# Patient Record
Sex: Male | Born: 1955 | Race: White | Hispanic: No | Smoking: Current every day smoker
Health system: Southern US, Community
[De-identification: ages and names within clinical notes are randomized; demographics above are authoritative.]

## PROBLEM LIST (undated history)

## (undated) DIAGNOSIS — I10 Essential (primary) hypertension: Secondary | ICD-10-CM

## (undated) DIAGNOSIS — C801 Malignant (primary) neoplasm, unspecified: Secondary | ICD-10-CM

## (undated) HISTORY — PX: LUNG CANCER SURGERY: SHX702

---

## 2002-06-12 ENCOUNTER — Encounter: Payer: Self-pay | Admitting: Cardiology

## 2002-06-12 ENCOUNTER — Observation Stay (HOSPITAL_COMMUNITY): Admission: EM | Admit: 2002-06-12 | Discharge: 2002-06-13 | Payer: Self-pay | Admitting: Emergency Medicine

## 2005-06-12 ENCOUNTER — Encounter: Admission: RE | Admit: 2005-06-12 | Discharge: 2005-06-12 | Payer: Self-pay | Admitting: Family Medicine

## 2016-05-16 DIAGNOSIS — E782 Mixed hyperlipidemia: Secondary | ICD-10-CM | POA: Insufficient documentation

## 2016-11-09 DIAGNOSIS — R7303 Prediabetes: Secondary | ICD-10-CM | POA: Diagnosis present

## 2018-01-08 ENCOUNTER — Telehealth: Payer: Self-pay | Admitting: Hematology

## 2018-01-08 NOTE — Telephone Encounter (Signed)
Spoke with patient regarding appointment D/T/Loc/Ph# °

## 2018-01-24 ENCOUNTER — Telehealth: Payer: Self-pay | Admitting: Hematology

## 2018-01-24 NOTE — Telephone Encounter (Signed)
PAL - moved new patient appointment from 1/28 to 2/7. Spoke with patient he is aware.

## 2018-01-27 ENCOUNTER — Encounter: Payer: Self-pay | Admitting: Hematology

## 2018-01-30 ENCOUNTER — Telehealth: Payer: Self-pay | Admitting: Hematology

## 2018-01-30 NOTE — Telephone Encounter (Signed)
Patient left message wanting to cancel 2/7 appointment with Dr. Candise CheKale and reschedule. Returned call and was not able to reach patient. Left message for patient confirming I received his message and cancelled 2/7 appointment. Patient asked to call me back directly to regarding when he can come in for appointment.

## 2018-01-30 NOTE — Telephone Encounter (Signed)
Returned call to patient re rescheduling new patient appointment. Spoke with patient re new appointment for 2/20.

## 2018-02-06 ENCOUNTER — Encounter: Payer: Self-pay | Admitting: Hematology

## 2018-02-17 NOTE — Progress Notes (Signed)
HEMATOLOGY/ONCOLOGY CONSULTATION NOTE  Date of Service: 02/19/2018  Patient Care Team: Patient, No Pcp Per as PCP - General (General Practice)  CHIEF COMPLAINTS/PURPOSE OF CONSULTATION:  Leukocytosis  HISTORY OF PRESENTING ILLNESS:   Jerry Wall is a wonderful 63 y.o. male who has been referred to [place] by Charlies Silvers P.A. for evaluation and management of leukocytosis.  The pt reports that he is doing well overall. The pt reports that he recalls being first aware of his high WBCs a few years ago, and that initially it was picked up through a routine visit. He denies any recent infections, nor taking steroids recently. He notes beginning cholesterol medication at the first of the year. He denies using any inhalers or having difficulty breathing or asthma-related symptoms.   He reports smoking a pack and a half of cigarettes each day. He reports that up until 10 years ago he had problems with excessive drinking, but has significantly decreased his ETOH intake since then. He reports having no desire to quit smoking or drinking because he enjoys them too much. He denies having had liver problems in the past.   Most recent lab results (01/01/18) of CBC is as follows: all values are WNL except for WBC at 12.1k, RBC at 4.56, MCV at 102.4, MCH at 34.1, Platelets at 370k.  On 12/27/16 WBC were at 13.4k, and on 12/21/16 his WBC were at 11.7k. On 06/18/16 his WBC were at 13.0k, and on 06/09/14 his WBC were at 13.7k.   On review of systems, pt denies fevers, chills, night sweats, breathing problems, abdominal pains, signs of infections, nausea, mouth sores, ulcers, back pains, and any other symptoms.   On PMHx the pt reports no significant history. On Social Hx the pt reports smoking a pack and a half of cigarettes each day. 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 He reports drinking about 5 drinks of ETOH each day.  On Family Hx the pt  reports liver cancer, but denies any other blood or cancer problems.   MEDICAL HISTORY:   1) Impaired fasting glucose 2) Cigarette nicotine dependence 3) Heravy ETOH use 4) TMJ Syndrome 5) Dyslipidemia   SURGICAL HISTORY:  Colonoscopy Dr [address] 08/10/2013  SOCIAL HISTORY: Social History   Socioeconomic History  . Marital status: Divorced    Spouse name: Not on file  . Number of children: Not on file  . Years of education: Not on file  . Highest education level: Not on file  Social Needs  . Financial resource strain: Not on file  . Food insecurity - worry: Not on file  . Food insecurity - inability: Not on file  . Transportation needs - medical: Not on file  . Transportation needs - non-medical: Not on file  Occupational History  . Not on file  Tobacco Use  . Smoking status: Current Every Day Smoker    Packs/day: 1.50    Years: 45.00    Pack years: 67.50    Types: Cigarettes  . Smokeless tobacco: Never Used  Substance and Sexual Activity  . Alcohol use: Yes    Comment: 4-5 beers daily  . Drug use: No  . Sexual activity: Not on file  Other Topics Concern  . Not on file  Social History Narrative  . Not on file    FAMILY HISTORY: No family history on file.  ALLERGIES:  has No Known Allergies.  MEDICATIONS:  Current Outpatient Medications  Medication  Sig Dispense Refill  . meloxicam (MOBIC) 15 MG tablet Take 15 mg by mouth daily.    . rosuvastatin (CRESTOR) 5 MG tablet Take 5 mg by mouth daily.    . sildenafil (VIAGRA) 100 MG tablet Take 100 mg by mouth as needed.     No current facility-administered medications for this visit.     REVIEW OF SYSTEMS:   .10 Point review of Systems was done is negative except as noted above.   PHYSICAL EXAMINATION: ECOG PERFORMANCE STATUS: 0 - Asymptomatic  VS reviewed in Epic - . GENERAL:alert, in no acute distress and comfortable SKIN: no acute rashes, no significant lesions EYES: conjunctiva are pink and  non-injected, sclera anicteric OROPHARYNX: MMM, no exudates, no oropharyngeal erythema or ulceration NECK: supple, no JVD LYMPH:  no palpable lymphadenopathy in the cervical, axillary or inguinal regions LUNGS: clear to auscultation b/l with normal respiratory effort HEART: regular rate & rhythm ABDOMEN:  normoactive bowel sounds , non tender, not distended. No palpable hepato-splenomegaly  Extremity: no pedal edema PSYCH: alert & oriented x 3 with fluent speech NEURO: no focal motor/sensory deficits    LABORATORY DATA:  I have reviewed the data as listed  . CBC Latest Ref Rng & Units 02/19/2018 02/19/2018  WBC 4.0 - 10.3 K/uL 13.6(H) -  Hematocrit 38.4 - 49.9 % 48.5 47.7  Platelets 140 - 400 K/uL 398 -   . CBC    Component Value Date/Time   WBC 13.6 (H) 02/19/2018 1155   RBC 4.81 02/19/2018 1155   RBC 4.81 02/19/2018 1155   HCT 48.5 02/19/2018 1155   HCT 47.7 02/19/2018 1154   PLT 398 02/19/2018 1155   MCV 100.8 (H) 02/19/2018 1155   MCH 34.1 (H) 02/19/2018 1155   MCHC 33.8 02/19/2018 1155   RDW 13.6 02/19/2018 1155   LYMPHSABS 4.2 (H) 02/19/2018 1155   MONOABS 1.3 (H) 02/19/2018 1155   EOSABS 0.1 02/19/2018 1155   BASOSABS 0.1 02/19/2018 1155    . CMP Latest Ref Rng & Units 02/19/2018  Glucose 70 - 140 mg/dL 85  BUN 7 - 26 mg/dL 14  Creatinine 2.130.70 - 0.861.30 mg/dL 5.781.19  Sodium 469136 - 629145 mmol/L 139  Potassium 3.5 - 5.1 mmol/L 4.5  Chloride 98 - 109 mmol/L 102  CO2 22 - 29 mmol/L 24  Calcium 8.4 - 10.4 mg/dL 52.810.1  Total Protein 6.4 - 8.3 g/dL 8.3  Total Bilirubin 0.2 - 1.2 mg/dL 0.4  Alkaline Phos 40 - 150 U/L 67  AST 5 - 34 U/L 20  ALT 0 - 55 U/L 43   Component     Latest Ref Rng & Units 02/19/2018  Folate, Hemolysate     Not Estab. ng/mL 395.0  HCT     37.5 - 51.0 % 47.7  Folate, RBC     >498 ng/mL 828  Retic Ct Pct     0.8 - 1.8 % 1.7  RBC.     4.20 - 5.82 MIL/uL 4.81  Retic Count, Absolute     34.8 - 93.9 K/uL 81.8  LDH     125 - 245 U/L 197    Vitamin B12     180 - 914 pg/mL 255    RADIOGRAPHIC STUDIES: I have personally reviewed the radiological images as listed and agreed with the findings in the report. No results found.  ASSESSMENT & PLAN:  63 y.o. male with  1. Leukocytosis/Neutrophilia Likely reactive and related to patients heavy smoking. Non progressive leucocytosis for atleast a  few years , no LNaednopathy or splenomegaly.  Smear - no increased blasts or left shift.  Overall presentation unlikely to represent a Myeloproliferative neoplasm Plan: -Discussed patient's most recent labs; leukocytosis since 2015, which has not progressed, and has fluctuated some, most likely indicating a reactive process to the patient's history of smoking.  -labs today show stable mild borderline neutrophilia, mild chronic lymphocytosis and borderline monocytosis - consistent with a reactive process. -would recommend f/u with PCP with CBC q6-12 months and re-consult [place] if WBC show progressive increase to >20k or if any new CBC abnormality arise -counseled on smoking cessation.  2) RBC Macrocytosis -likely from excessive ETOH use -Low B12 levels Plan -counseled on need to reduce ETOH use -B12 po daily recommended. -alsoRecommend taking a daily B complex. -f/u on rpt B12 levels with PCP In 6 months   Labs today RTC with Dr Candise Che as needed based on labs  All of the patients questions were answered with apparent satisfaction. The patient knows to call the clinic with any problems, questions or concerns.  . The total time spent in the appointment was 45 minutes and more than 50% was on counseling and direct patient cares.       Wyvonnia Lora MD MS AAHIVMS [facility] Hematology/Oncology Physician [facility]  (Office):       [phone] (Work cell):  [phone] (Fax):           [phone]  02/19/2018 10:40 AM   This document serves as a record of services personally performed by Wyvonnia Lora, MD. It  was created on his behalf by Marcelline Mates, a trained medical scribe. The creation of this record is based on the scribe's personal observations and the provider's statements to them.   .I have reviewed the above documentation for accuracy and completeness, and I agree with the above. Johney Maine MD MS

## 2018-02-19 ENCOUNTER — Inpatient Hospital Stay: Payer: 59 | Attending: Hematology | Admitting: Hematology

## 2018-02-19 ENCOUNTER — Encounter: Payer: Self-pay | Admitting: Hematology

## 2018-02-19 ENCOUNTER — Telehealth: Payer: Self-pay

## 2018-02-19 ENCOUNTER — Inpatient Hospital Stay: Payer: 59

## 2018-02-19 DIAGNOSIS — Z7289 Other problems related to lifestyle: Secondary | ICD-10-CM | POA: Diagnosis not present

## 2018-02-19 DIAGNOSIS — D7589 Other specified diseases of blood and blood-forming organs: Secondary | ICD-10-CM

## 2018-02-19 DIAGNOSIS — Z808 Family history of malignant neoplasm of other organs or systems: Secondary | ICD-10-CM | POA: Insufficient documentation

## 2018-02-19 DIAGNOSIS — Z720 Tobacco use: Secondary | ICD-10-CM | POA: Insufficient documentation

## 2018-02-19 DIAGNOSIS — D72829 Elevated white blood cell count, unspecified: Secondary | ICD-10-CM | POA: Diagnosis not present

## 2018-02-19 DIAGNOSIS — R718 Other abnormality of red blood cells: Secondary | ICD-10-CM | POA: Insufficient documentation

## 2018-02-19 LAB — CMP (CANCER CENTER ONLY)
ALBUMIN: 4.3 g/dL (ref 3.5–5.0)
ALT: 43 U/L (ref 0–55)
AST: 20 U/L (ref 5–34)
Alkaline Phosphatase: 67 U/L (ref 40–150)
Anion gap: 13 — ABNORMAL HIGH (ref 3–11)
BILIRUBIN TOTAL: 0.4 mg/dL (ref 0.2–1.2)
BUN: 14 mg/dL (ref 7–26)
CHLORIDE: 102 mmol/L (ref 98–109)
CO2: 24 mmol/L (ref 22–29)
CREATININE: 1.19 mg/dL (ref 0.70–1.30)
Calcium: 10.1 mg/dL (ref 8.4–10.4)
GFR, Est AFR Am: >60 mL/min (ref >60)
GLUCOSE: 85 mg/dL (ref 70–140)
POTASSIUM: 4.5 mmol/L (ref 3.5–5.1)
Sodium: 139 mmol/L (ref 136–145)
Total Protein: 8.3 g/dL (ref 6.4–8.3)

## 2018-02-19 LAB — CBC WITH DIFFERENTIAL (CANCER CENTER ONLY)
Basophils Absolute: 0.1 10*3/uL (ref 0.0–0.1)
Basophils Relative: 1 %
Eosinophils Absolute: 0.1 10*3/uL (ref 0.0–0.5)
Eosinophils Relative: 1 %
HEMATOCRIT: 48.5 % (ref 38.4–49.9)
HEMOGLOBIN: 16.4 g/dL (ref 13.0–17.1)
LYMPHS ABS: 4.2 10*3/uL — AB (ref 0.9–3.3)
LYMPHS PCT: 31 %
MCH: 34.1 pg — AB (ref 27.2–33.4)
MCHC: 33.8 g/dL (ref 32.0–36.0)
MCV: 100.8 fL — AB (ref 79.3–98.0)
MONO ABS: 1.3 10*3/uL — AB (ref 0.1–0.9)
MONOS PCT: 10 %
NEUTROS ABS: 7.9 10*3/uL — AB (ref 1.5–6.5)
NEUTROS PCT: 57 %
Platelet Count: 398 10*3/uL (ref 140–400)
RBC: 4.81 MIL/uL (ref 4.20–5.82)
RDW: 13.6 % (ref 11.0–14.6)
WBC Count: 13.6 10*3/uL — ABNORMAL HIGH (ref 4.0–10.3)

## 2018-02-19 LAB — RETICULOCYTES
RBC.: 4.81 MIL/uL (ref 4.20–5.82)
Retic Count, Absolute: 81.8 10*3/uL (ref 34.8–93.9)
Retic Ct Pct: 1.7 % (ref 0.8–1.8)

## 2018-02-19 LAB — VITAMIN B12: Vitamin B-12: 255 pg/mL (ref 180–914)

## 2018-02-19 LAB — LACTATE DEHYDROGENASE: LDH: 197 U/L (ref 125–245)

## 2018-02-19 LAB — SAVE SMEAR

## 2018-02-19 NOTE — Telephone Encounter (Signed)
Per 2/20 los. Added patient to lab for today will return depending on lab results.

## 2018-02-19 NOTE — Patient Instructions (Signed)
Thank you for choosing [facility] to provide your oncology and hematology care.  To afford each patient quality time with our providers, please arrive 30 minutes before your scheduled appointment time.  If you arrive late for your appointment, you may be asked to reschedule.  We strive to give you quality time with our providers, and arriving late affects you and other patients whose appointments are after yours.   If you are a no show for multiple scheduled visits, you may be dismissed from the clinic at the providers discretion.    Again, thank you for choosing [facility], our hope is that these requests will decrease the amount of time that you wait before being seen by our physicians.  ______________________________________________________________________  Should you have questions after your visit to the [facility], please contact our office at [phone] between the hours of 8:30 and 4:30 p.m.    Voicemails left after 4:30p.m will not be returned until the following business day.    For prescription refill requests, please have your pharmacy contact us directly.  Please also try to allow 48 hours for prescription requests.    Please contact the scheduling department for questions regarding scheduling.  For scheduling of procedures such as PET scans, CT scans, MRI, Ultrasound, etc please contact central scheduling at [phone].    Resources For Cancer Patients and Caregivers:   Oncolink.org:  A wonderful resource for patients and healthcare providers for information regarding your disease, ways to tract your treatment, what to expect, etc.     American Cancer Society:  800-227-2345  Can help patients locate various types of support and financial assistance  Cancer Care: 1-800-813-HOPE (4673) Provides financial assistance, online support groups, medication/co-pay assistance.    [place] DSS:  336-641-3447 Where to apply for food  stamps, Medicaid, and utility assistance  Medicare Rights Center: 800-333-4114 Helps people with Medicare understand their rights and benefits, navigate the Medicare system, and secure the quality healthcare they deserve  SCAT: 336-333-6589 [place] [employer]'s shared-ride transportation service for eligible riders who have a disability that prevents them from riding the fixed route bus.    For additional information on assistance programs please contact our social worker:   Grier Hock/Abigail Elmore:  [phone]            

## 2018-02-20 LAB — FOLATE RBC
Folate, Hemolysate: 395 ng/mL
Folate, RBC: 828 ng/mL (ref >498)
Hematocrit: 47.7 % (ref 37.5–51.0)

## 2018-02-26 ENCOUNTER — Telehealth: Payer: Self-pay

## 2018-02-26 NOTE — Telephone Encounter (Signed)
Called pt with update on lab work per Dr. Candise CheKale. WBC elevated. Likely d/t smoking. Pt B12 low and pt to start taking OTC B12 po 1000mcg daily. Pt to f/u with PCP in 6 months to repeat lab work. Pt verbalized understanding of changes.

## 2018-03-03 ENCOUNTER — Encounter: Payer: Self-pay | Admitting: Hematology and Oncology

## 2018-06-27 ENCOUNTER — Other Ambulatory Visit: Payer: Self-pay | Admitting: Physician Assistant

## 2018-06-27 DIAGNOSIS — R0989 Other specified symptoms and signs involving the circulatory and respiratory systems: Secondary | ICD-10-CM

## 2018-07-07 ENCOUNTER — Ambulatory Visit
Admission: RE | Admit: 2018-07-07 | Discharge: 2018-07-07 | Disposition: A | Discharge status: Home / Self Care | Payer: 59 | Source: Ambulatory Visit | Attending: Physician Assistant | Admitting: Physician Assistant

## 2018-07-07 DIAGNOSIS — R0989 Other specified symptoms and signs involving the circulatory and respiratory systems: Secondary | ICD-10-CM

## 2020-01-04 ENCOUNTER — Other Ambulatory Visit: Payer: Self-pay | Admitting: Physician Assistant

## 2020-01-04 DIAGNOSIS — Z122 Encounter for screening for malignant neoplasm of respiratory organs: Secondary | ICD-10-CM

## 2020-01-12 ENCOUNTER — Ambulatory Visit: Payer: 59

## 2020-01-12 ENCOUNTER — Ambulatory Visit
Admission: RE | Admit: 2020-01-12 | Discharge: 2020-01-12 | Disposition: A | Discharge status: Home / Self Care | Payer: Managed Care, Other (non HMO) | Source: Ambulatory Visit | Attending: Physician Assistant | Admitting: Physician Assistant

## 2020-01-12 DIAGNOSIS — Z122 Encounter for screening for malignant neoplasm of respiratory organs: Secondary | ICD-10-CM

## 2020-03-25 DIAGNOSIS — Z902 Acquired absence of lung [part of]: Secondary | ICD-10-CM

## 2020-04-12 DIAGNOSIS — C3411 Malignant neoplasm of upper lobe, right bronchus or lung: Secondary | ICD-10-CM | POA: Diagnosis present

## 2020-08-10 DIAGNOSIS — C3412 Malignant neoplasm of upper lobe, left bronchus or lung: Secondary | ICD-10-CM | POA: Diagnosis present

## 2021-02-08 IMAGING — CT CT CHEST LUNG CANCER SCREENING LOW DOSE W/O CM
2 series · 13 of 35 positions shown, 16 images · non-contrast
Comparison: None.

CLINICAL DATA: Lung cancer screening. Current asymptomatic smoker
with 80 pack-year history.

EXAM:
CT CHEST WITHOUT CONTRAST LOW-DOSE FOR LUNG CANCER SCREENING
TECHNIQUE: Multidetector CT imaging of the chest was performed following the
standard protocol without IV contrast.

[Series 2: lung 5.00 br40 axial · axial · 0.59mm/px · z∈[-1073,-788]mm · 10 of 67 slices shown, 13 images]
[im 5/67  mediastinal]
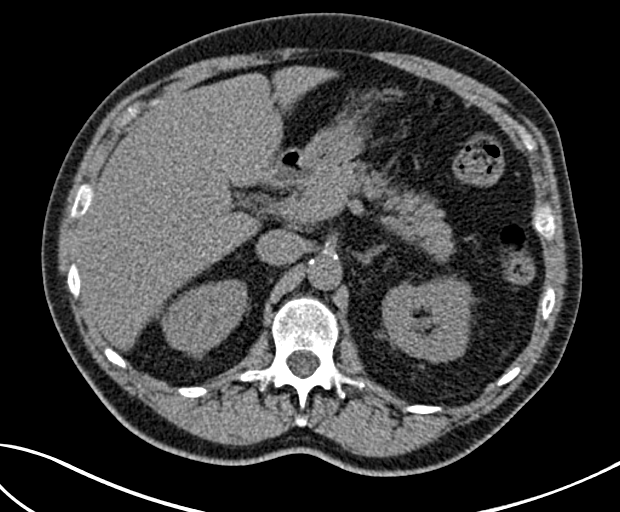
[im 5/67  lung]
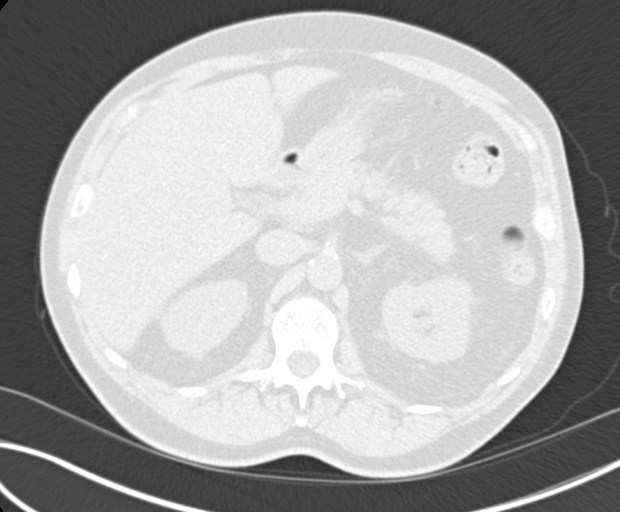
[im 13/67  lung]
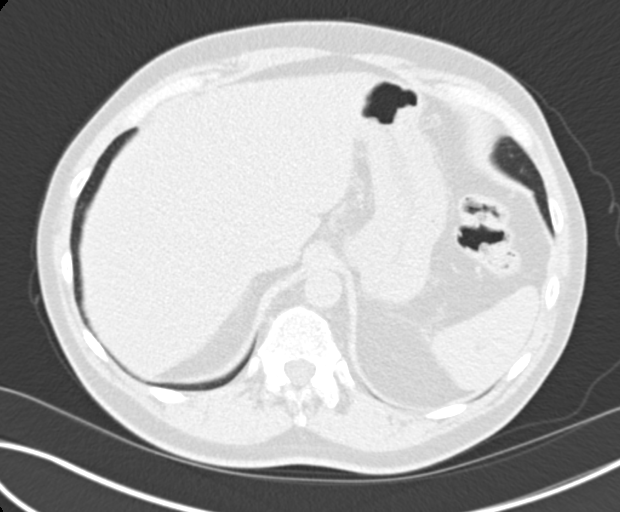
[im 18/67  lung]
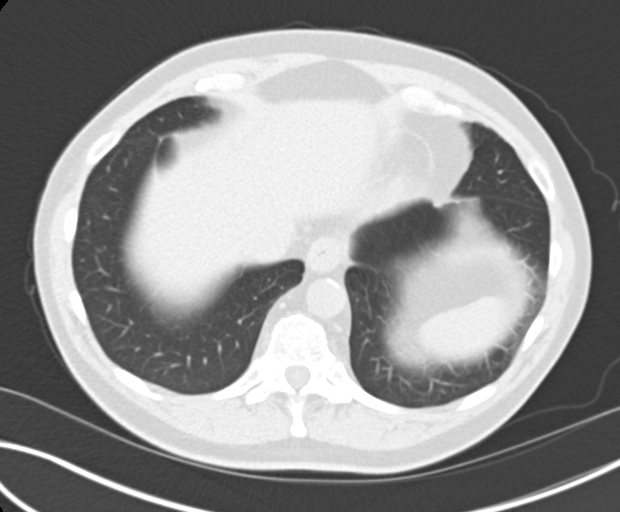
[im 25/67  lung]
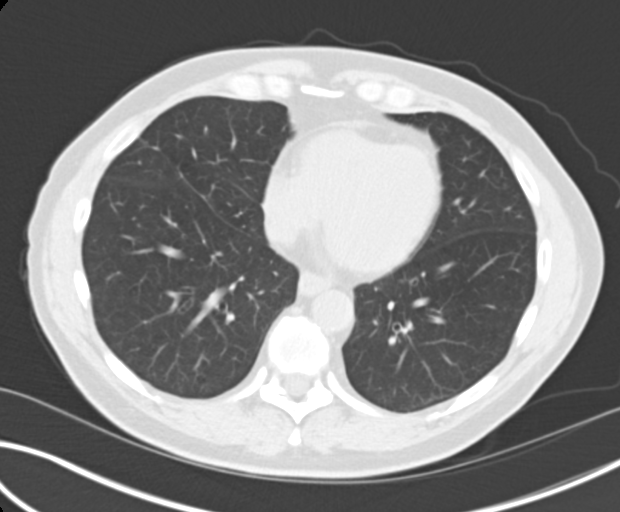
[im 32/67  mediastinal]
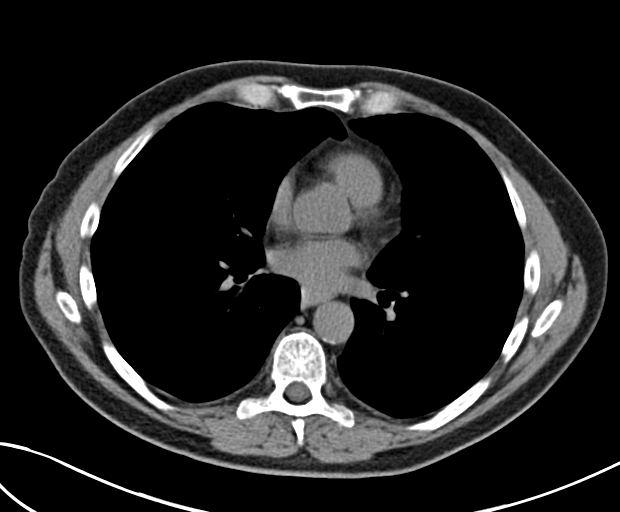
[im 32/67  lung]
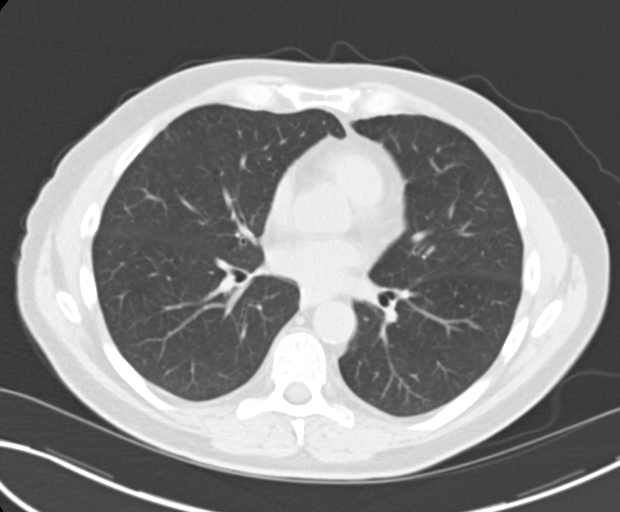
[im 35/67  lung]
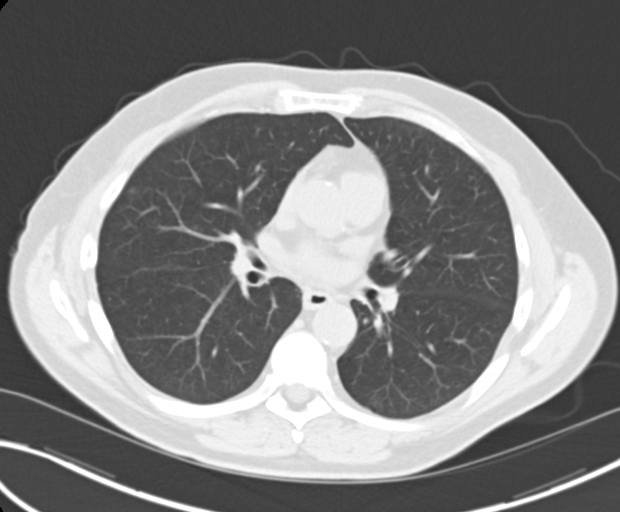
[im 42/67  lung]
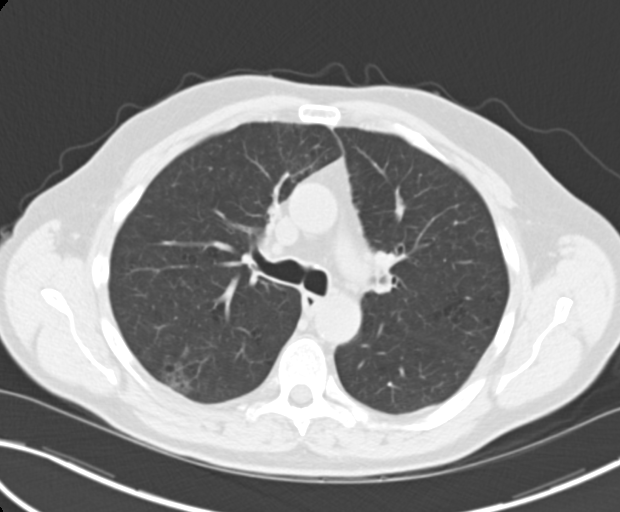
[im 49/67  lung]
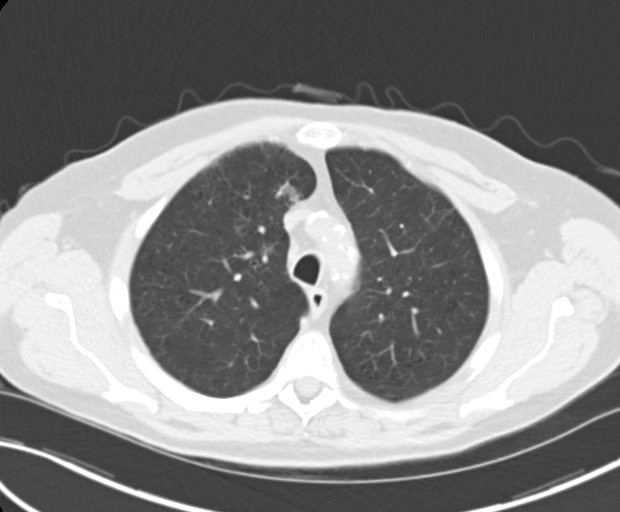
[im 54/67  mediastinal]
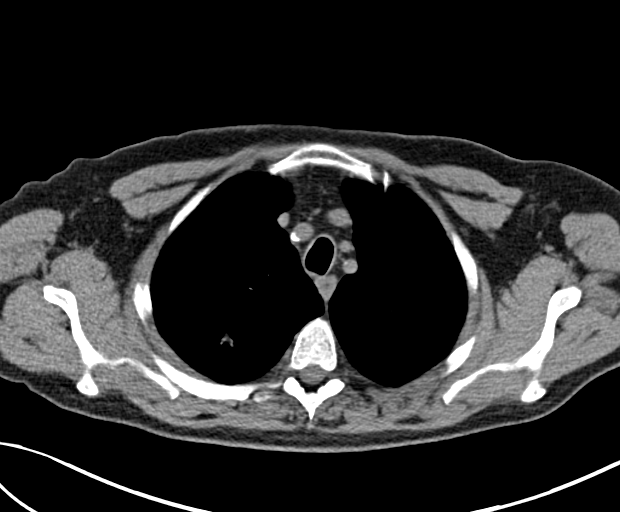
[im 54/67  lung]
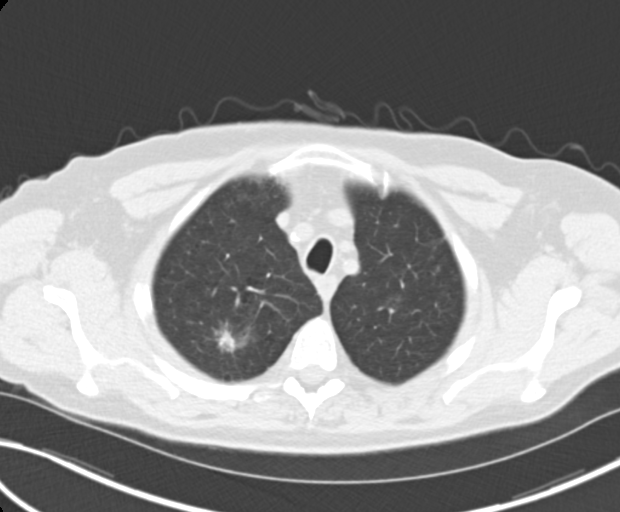
[im 62/67  lung]
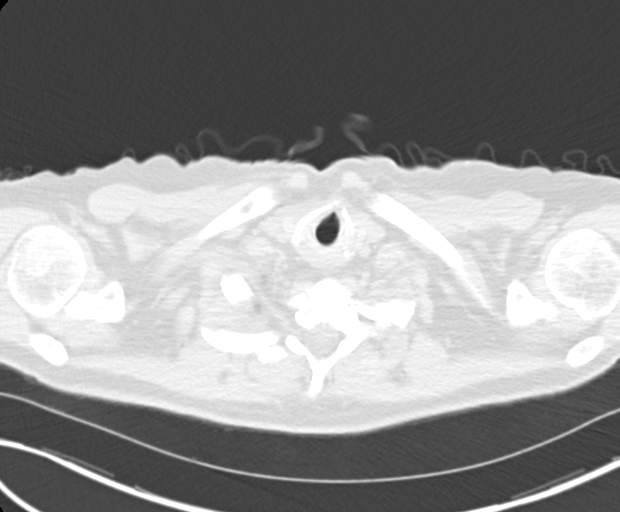

[Series 4: lung 1.00 br44 cor · coronal · 0.66mm/px · 3 of 297 slices shown]
[im 60/297  lung]
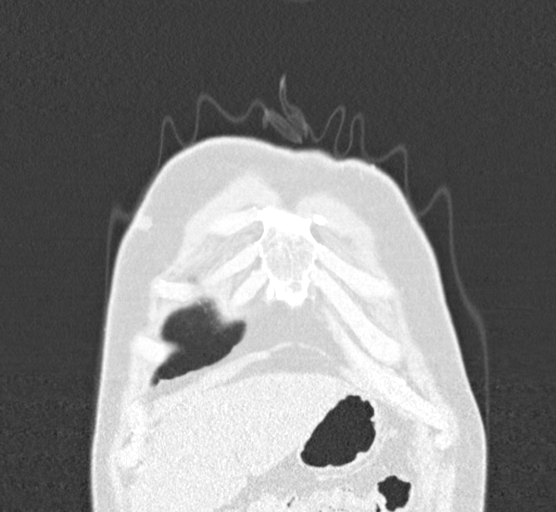
[im 119/297  lung]
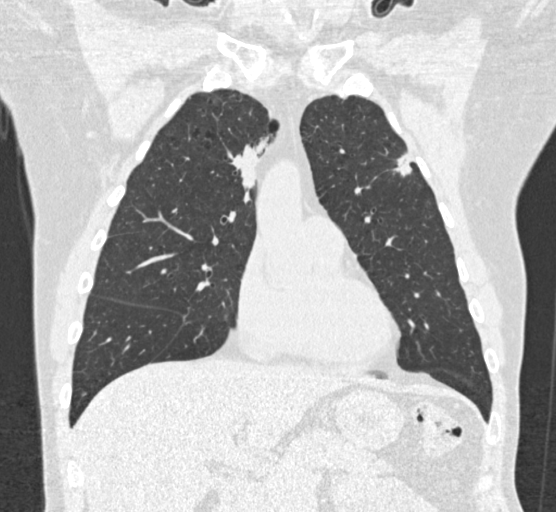
[im 178/297  lung]
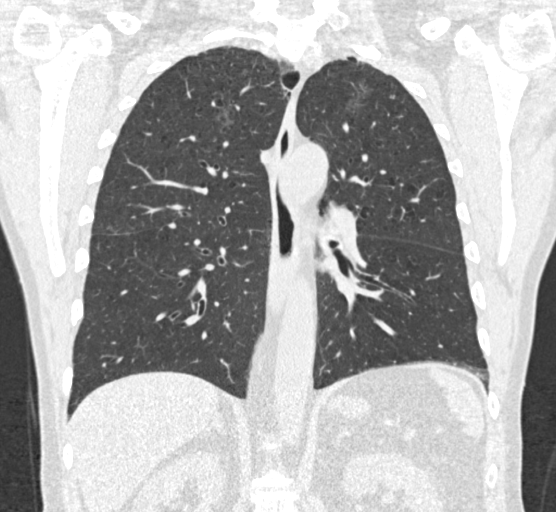

[13 of 35 positions shown; findings below may reference images not displayed]

FINDINGS: Cardiovascular: The heart size appears within normal limits. No
pericardial effusion. Aortic atherosclerosis.

Mediastinum/Nodes: No enlarged mediastinal, hilar, or axillary lymph
nodes. Thyroid gland, trachea, and esophagus demonstrate no
significant findings.

Lungs/Pleura: Advanced changes of centrilobular and paraseptal
emphysema. Diffuse bronchial wall thickening. No pleural effusion,
airspace consolidation or atelectasis. Multiple solid and non solid
lesions are identified in both lungs with an upper lung zone
predominance. The largest solid lesion is in the paramediastinal
right upper lobe with a mean derived diameter of 2.6 cm. In the left
upper lobe there is a solid spiculated nodule which has a mean
derived diameter of 1.6 cm. In the posterior right upper lobe there
is a spiculated lesion with a mean diameter of 11.6 cm.
Additionally, multifocal bilateral ground-glass attenuating nodules
are scattered throughout both lungs.

Upper Abdomen: No acute abnormality.

Musculoskeletal: No chest wall mass or suspicious bone lesions
identified.
IMPRESSION: 1. Lung-RADS 4Bs, suspicious. Additional imaging evaluation or
consultation with Pulmonology or Thoracic Surgery recommended.
2. The S modifier above refers to multifocal solid spiculated
lesions within both upper lobes concerning for malignancy. Further
investigation with PET-CT is recommended followed by tissue
sampling. Additionally, there are multiple non solid lesions within
upper lung zone predominance which may represent multifocal
pulmonary adenocarcinoma.
3. Aortic Atherosclerosis (ZUI45-MV6.6) and Emphysema (ZUI45-3DV.B).

## 2022-05-17 NOTE — Progress Notes (Signed)
 Subjective:    Patient ID: Jerry Wall. is a 67 y.o. male.  HPI  Jerry Wall. is a 67 y.o. 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 Blake of Rad/Onc.   Treatment: Multipleprimary lung cancers. In 2021, less than 1% of cells were positive for PD-L1. EGFR, ROS1, and ALK were negative.  1. S/P right upper lobectomy 03/23/2020 removing 2 right upper lobe primaries. This lefta left upper lobe 1.7 x 1.4 x 1.5. This is his new baseline.He did had adjuvant chemotherapy for the 4 cm tumor.  2. S/P4cycle Cisplatin/Alimta.05/02/2020 to7/05/2020 had significant hiccups With first dose (10 days up to 100 an hour).   Post adjuvant chemo:CT of the chest and the pelvis on 07/25/2020 showeddiminished consolidative changes resection margin in the right chest. Diminished left upper lobe pulmonary nodule. This suggests response to treatment. The areas of hyperdensity and hypervascularity along the left hemiliver. Septal groundglass and septal thickening with suggestion of mild bronchiectatic changes in the left upper lobe.The upper lobe nodule had shrunk from 1.7 x 1.4 to 1 x 0.7.  Today Jerry Wall reports he was having follow-up for his lung cancer when they noted progression and decided to take further imaging of his brain.  He states he has not noticed any new symptoms.  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  He also continues to smoke half pack of cigarettes per day.  He admits over the last few weeks his vision has become a little bit blurry however he attributes this to his contacts.  Denies any diplopia or peripheral deficits.  Denies any seizure activity.  He does admit to some lower extremity fatigue occasionally.  Denies headaches,  dizziness, nausea, vomiting, hearing loss, changes in balance or gait, weakness, numbness, tingling, changes in dexterity, foot drop, confusion or speech difficulties.  The following portions of the patient's history were reviewed and updated as appropriate: allergies, current medications, past family history, past medical history, past social history, past surgical history and problem list.  Review of Systems  Constitutional: Negative for activity change and unexpected weight change.  HENT: Negative for hearing loss, trouble swallowing and voice change.   Eyes: Negative for visual disturbance.  Gastrointestinal: Negative for nausea and vomiting.  Genitourinary: Negative for enuresis.  Musculoskeletal: Negative for back pain, gait problem, neck pain and neck stiffness.  Neurological: Negative for dizziness, seizures, syncope, facial asymmetry, speech difficulty, weakness, light-headedness, numbness and headaches.  Psychiatric/Behavioral: Negative for agitation, behavioral problems, confusion, decreased concentration and dysphoric mood.  All other systems reviewed and are negative.   Objective:  Neurologic Exam   Mental Status  Oriented to person, place, and time.  Attention: normal. Concentration: normal.  Speech: speech is normal  Level of consciousness: alert Knowledge: good.  Normal comprehension.   Cranial Nerves   CN II  Visual fields full to confrontation.  Visual acuity: normal with correction Right visual field deficit: none Left visual field deficit: none   CN III, IV, VI  Pupils are equal, round, and reactive to light. Extraocular motions are normal.   CN V  Facial sensation intact.   CN VII  Facial expression full, symmetric.   CN VIII  Hearing: intact  CN XI  CN XI normal.   CN XII  CN XII normal.   Motor Exam  Muscle bulk: normal Overall muscle tone: normal  Strength  Strength 5/5 throughout.   Sensory Exam  Light touch normal.   Gait,  Coordination, and Reflexes   Gait Gait: normal  Coordination  Romberg: negative Tandem walking coordination: normal   Physical Exam Vitals reviewed.  Constitutional:      Appearance: Normal appearance.  HENT:     Head: Normocephalic.  Eyes:     Extraocular Movements: Extraocular movements intact and EOM normal.     Pupils: Pupils are equal, round, and reactive to light.  Cardiovascular:     Rate and Rhythm: Normal rate.  Pulmonary:     Effort: Pulmonary effort is normal.  Musculoskeletal:        General: Normal range of motion.     Cervical back: Normal range of motion and neck supple.  Skin:    General: Skin is warm and dry.  Neurological:     General: No focal deficit present.     Mental Status: He is alert and oriented to person, place, and time.     Motor: Motor strength is normal.     Coordination: Romberg Test normal.     Gait: Gait is intact. Tandem walk normal.  Psychiatric:        Mood and Affect: Mood normal.        Speech: Speech normal.        Behavior: Behavior normal.        Thought Content: Thought content normal.        Judgment: Judgment normal.    I spent a total of 20 minutes in face-to-face and non-face-to-face activities for this patient on the day of the visit. Professional time spent includes the following activities: reviewing previous medical and surgical records, evaluating the patient's current symptoms/allergies/medications, completing the physical examination, ordering tests and medications and documenting in the electronic medical record. This was independent of the time spent by the physician.  This is a shared visit with Dr. Yancy Fare and Katelyn Welch, PA-C. Electronically signed by:  Katelyn Ann Welch, PA-C, 05/18/2022 10:21 AM  Assessment:  Assessment   This 67 year old man presents for evaluation and management of a newly identified 6 mm left parietal brain metastasis secondary to lung adenocarcinoma. This mass is currently  asymptomatic.    Plan:  Plan   I reviewed the imaging findings with Jerry Wall and discussed management options. I recommend Gamma Knife radiosurgery (GKRS) for him. Jerry Wall demonstrated good understanding and requests GKRS. Arrangements will be made for Jerry Wall to undergo GKRS on the next available treatment date.  I have personally spent 45 minutes involved in face-to-face and non-face-to-face activities for this patient on the day of the visit.  Professional time spent includes the following activities, in addition to those noted in the documentation: independent review of imaging, assessment, and management plan.  Electronically signed by: Yancy Ryan Fare, MD 05/18/2022 10:28 AM        Electronically signed by: Yancy Ryan Fare, MD 05/18/22 1034

## 2022-05-18 DIAGNOSIS — C7931 Secondary malignant neoplasm of brain: Secondary | ICD-10-CM | POA: Diagnosis present

## 2022-05-30 NOTE — Progress Notes (Signed)
 GKRS brain met of lung primary   Electronically signed by: Yancy Ryan Fare, MD 05/30/22 [phone]

## 2022-05-30 NOTE — Unmapped External Note (Signed)
 Procedure date: May 29, 2022  Pre-operative Diagnosis: Brain metastasis of lung carcinoma primary.  Post-operative Diagnosis:  Same.  Procedure:  Gamma Knife stereotactic radiosurgery. LESION TREATED:   1. Left parietal metastasis (20 Gy to the 50% isodose line)   COLLIMATOR SIZE:  4, 8 mm  TOTAL NUMBER OF ISOCENTERS:   4  Surgeon:  Yancy MICAEL Fare, MD, [facility], [facility]  Anesthesia:  Local.  Operative Indications: We discussed the risks and potential benefits of radiosurgery, fractionated radiation, systemic or local chemotherapy, experimental treatments, open surgery, combinations of these treatments, and observation. Possible late effects of radiation including radiation necrosis requiring steroids or surgery and leading to temporary or permanent loss of any or all neurologic functions were explained.  The potential disadvantages of not having a tissue diagnosis of each lesion were reviewed.  Risks of glucocorticoids including aseptic necrosis of the major joints, osteoporotic fractures, diabetes with all of its risks, axial weight gain, DVT/PE, and bowel perforation which could be fatal were covered. We discussed the roles of all of the members of the radiosurgical team and the potential benefits and risks of overlapping procedures; informed consent for overlapping procedures was obtained.  Mr. Meloche expresses good understanding of the relative risks and potential benefits of the available alternatives and requests radiosurgery with the [facility].  Description of the Procedure:  Mr. Cerrone was admitted to the [facility] Suite where a four-pin Le[place]ell stereotactic frame was placed using local anesthetic consisting of 2% lidocaine  mixed 1:1 with long-acting local anesthetic. Prophylactic antibiotics are not indicated for [facility] radiosurgery because of the absence of a surgical incision. DVT prophylaxis is not indicated during Gamma Knife radiosurgery for Mr. Creamer because no anesthesia is  employed and frequent extremity movement/ambulation occurs throughout the procedure. Magnetic resonance imaging was obtained including high-resolution, post intravenous gadolinium, volumetric images through the lesions.  The images were transferred to the [facility] workstation.  A plan was designed that nicely encompassed the metastasis. Dr. Christina K. Cramer then prescribed a dose of 20 Gy to the 50% isodose line which conformed to the outlined tumor-volume.  Mr. Utsey was admitted to the [facility] suite where pre-procedure verification (time out) was performed per institutional protocol. The radiosurgical dose was administered without incident.   At the conclusion of treatment the stereotactic frame was removed and the pin sites were treated with antibiotic ointment and dressed with Band-Aids. No incision was made nor were instruments or sponges inserted; thus all sponges and instruments were accounted for.  Mr. Burgard was then discharged with plans for follow up with a brain MRI scan in approximately twelve weeks. Signs of increased intracranial pressure were reviewed. Focal neurologic problems were also described. He understands that worsening symptoms may occur as the tumor dies and that if they do steroids (Decadron ) will be a primary treatment modality and that if the steroids are unsuccessful open surgery may be required. He knows to call or seek medical attention before then should any problems or questions arise that are potentially related to the tumor or to the [facility] treatment and agrees to do so.   My contact information is: Department of Neurosurgery [facility] of Medicine [facility] [place], [place] [address] Direct Phone: [phone].    Electronically signed by: Yancy Ryan Fare, MD 05/30/22 [phone]

## 2022-09-06 DIAGNOSIS — C781 Secondary malignant neoplasm of mediastinum: Secondary | ICD-10-CM | POA: Diagnosis present

## 2022-09-06 DIAGNOSIS — C3490 Malignant neoplasm of unspecified part of unspecified bronchus or lung: Secondary | ICD-10-CM | POA: Insufficient documentation

## 2023-07-13 ENCOUNTER — Emergency Department (HOSPITAL_BASED_OUTPATIENT_CLINIC_OR_DEPARTMENT_OTHER): Payer: Medicare Other | Admitting: Radiology

## 2023-07-13 ENCOUNTER — Other Ambulatory Visit: Payer: Self-pay

## 2023-07-13 ENCOUNTER — Emergency Department (HOSPITAL_BASED_OUTPATIENT_CLINIC_OR_DEPARTMENT_OTHER)
Admission: EM | Admit: 2023-07-13 | Discharge: 2023-07-13 | Disposition: A | Discharge status: Home / Self Care | Payer: Medicare Other | Attending: Emergency Medicine | Admitting: Emergency Medicine

## 2023-07-13 DIAGNOSIS — W108XXA Fall (on) (from) other stairs and steps, initial encounter: Secondary | ICD-10-CM | POA: Diagnosis not present

## 2023-07-13 DIAGNOSIS — R0781 Pleurodynia: Secondary | ICD-10-CM

## 2023-07-13 DIAGNOSIS — J189 Pneumonia, unspecified organism: Secondary | ICD-10-CM | POA: Diagnosis not present

## 2023-07-13 DIAGNOSIS — W19XXXA Unspecified fall, initial encounter: Secondary | ICD-10-CM

## 2023-07-13 MED ORDER — HYDROCODONE-ACETAMINOPHEN 5-325 MG PO TABS: 1.0000 | ORAL_TABLET | Freq: Four times a day (QID) | ORAL | 0 refills | Status: DC | PRN

## 2023-07-13 MED ORDER — LIDOCAINE 5 % EX PTCH: 1.0000 | MEDICATED_PATCH | CUTANEOUS | 0 refills | Status: DC

## 2023-07-13 MED ORDER — AZITHROMYCIN 250 MG PO TABS: 250.0000 mg | ORAL_TABLET | Freq: Every day | ORAL | 0 refills | Status: DC

## 2023-07-13 MED ORDER — LIDOCAINE 5 % EX PTCH
1.0000 | MEDICATED_PATCH | CUTANEOUS | Status: DC
  Administered 2023-07-13: 1 via TRANSDERMAL
  Filled 2023-07-13: qty 1

## 2023-07-13 MED ORDER — AMOXICILLIN-POT CLAVULANATE 875-125 MG PO TABS: 1.0000 | ORAL_TABLET | Freq: Two times a day (BID) | ORAL | 0 refills | Status: DC

## 2023-07-13 MED ORDER — HYDROCODONE-ACETAMINOPHEN 5-325 MG PO TABS
1.0000 | ORAL_TABLET | Freq: Once | ORAL | Status: AC
  Administered 2023-07-13: 1 via ORAL
  Filled 2023-07-13: qty 1

## 2023-07-13 NOTE — ED Provider Notes (Signed)
[facility]ERGENCY DEPARTMENT AT [facility] Provider Note   CSN: [id number] Arrival date & time: 07/13/23  1541     History  Chief Complaint  Patient presents with   Back Pain   Fall    Jerry Wall is a 68 y.o. male.  Patient with history of metastatic lung cancer currently on immunotherapy presents today with complaints of fall.  He states that same occurred a few days ago when he lost his balance and fell backwards on 1 step.  He states that he hit the middle of his back on the ground.  He did not hit his head or lose consciousness.  He is not anticoagulated.  He was able to get up off the ground and walk around without issue.  He walks with a cane which is his baseline.  He notes pain to his left upper back in his rib cage area.  States that it hurts to take a deep breath.  He tried to manage his pain with over-the-counter medication but has not been successful.  Denies any chest pain or shortness of breath.  Does state that he has had an increased cough and congestion over the last few days as well.  He denies any headache, neck pain, low back pain, sharp shooting pain in his extremities, numbness/tingling.  No loss of bowel or bladder function or saddle paresthesias.   Back Pain Fall       Home Medications Prior to Admission medications   Medication Sig Start Date End Date Taking? Authorizing Provider  meloxicam (MOBIC) 15 MG tablet Take 15 mg by mouth daily. 02/12/18   [provider]  rosuvastatin (CRESTOR) 5 MG tablet Take 5 mg by mouth daily. 01/02/18   [provider]  sildenafil (VIAGRA) 100 MG tablet Take 100 mg by mouth as needed. 01/02/18   [provider]      Allergies    Patient has no known allergies.    Review of Systems   Review of Systems  Musculoskeletal:  Positive for back pain.  All other systems reviewed and are negative.   Physical Exam Updated Vital Signs BP 113/77   Pulse 88   Temp 98.4 F (36.9 C) (Oral)    Resp 18   SpO2 95%  Physical Exam Vitals and nursing note reviewed.  Constitutional:      General: He is not in acute distress.    Appearance: Normal appearance. He is normal weight. He is not ill-appearing, toxic-appearing or diaphoretic.  HENT:     Head: Normocephalic and atraumatic.     Comments: No racoon eyes No battle sign Cardiovascular:     Rate and Rhythm: Normal rate and regular rhythm.     Heart sounds: Normal heart sounds.  Pulmonary:     Effort: Pulmonary effort is normal. No respiratory distress.     Breath sounds: Normal breath sounds.  Abdominal:     General: Abdomen is flat.     Palpations: Abdomen is soft.     Tenderness: There is no abdominal tenderness.  Musculoskeletal:        General: Normal range of motion.     Cervical back: Normal, normal range of motion and neck supple. No tenderness.     Thoracic back: Normal.     Lumbar back: Normal.       Back:     Comments: TTP over the left lateral rib cage per above images.  No bruising, deformity, crepitus, or overlying skin changes.  No  tenderness to palpation of cervical, thoracic, or lumbar spine.  Patient observed to be ambulatory with his cane which is his baseline.  No other areas of focal bony tenderness.  Skin:    General: Skin is warm and dry.  Neurological:     General: No focal deficit present.     Mental Status: He is alert and oriented to person, place, and time.  Psychiatric:        Mood and Affect: Mood normal.        Behavior: Behavior normal.     ED Results / Procedures / Treatments   Labs (all labs ordered are listed, but only abnormal results are displayed) Labs Reviewed - No data to display  EKG None  Radiology DG Ribs Unilateral W/Chest Left  Result Date: 07/13/2023 CLINICAL DATA:  Fall EXAM: LEFT RIBS AND CHEST - 3+ VIEW COMPARISON:  02/26/2022, PET CT 07/05/2023 FINDINGS: Stable pulmonary insufflation. Bilateral upper lobe parenchymal scarring again noted. Patchy opacity  within the left mid lung zone is new from prior examination as well as prior PET CT examination and may represent a developing infectious or inflammatory infiltrate. Cardiac size within normal limits. No pneumothorax or pleural effusion. Right internal jugular chest port tip seen at the superior cavoatrial junction. Pulmonary vascularity is normal. No acute bone abnormality. Specifically, no acute rib fracture identified. IMPRESSION: 1. No acute rib fracture identified. 2. Patchy opacity within the left mid lung zone, new from prior examination as well as prior PET CT examination and may represent a developing infectious or inflammatory infiltrate. Radiographic follow-up to resolution is recommended. Electronically Signed   By: Helyn Numbers M.D.   On: 07/13/2023 20:10    Procedures Procedures    Medications Ordered in ED Medications  lidocaine (LIDODERM) 5 % 1 patch (1 patch Transdermal Patch Applied 07/13/23 1923)  HYDROcodone-acetaminophen (NORCO/VICODIN) 5-325 MG per tablet 1 tablet (1 tablet Oral Given 07/13/23 1923)    ED Course/ Medical Decision Making/ A&P                             Medical Decision Making Amount and/or Complexity of Data Reviewed Radiology: ordered.  Risk Prescription drug management.   This patient is a 68 y.o. male who presents to the ED for concern of fall x 3 days ago with left ribcage pain.   Differential diagnoses prior to evaluation: Rib fracture, pulmonary contusion, trauma  Past Medical History / Social History / Additional history: Chart reviewed. Pertinent results include: History of metastatic lung cancer currently on immunotherapy  Physical Exam: Physical exam performed. The pertinent findings include: TTP left lateral rib cage per above  Imaging: Ordered left rib cage x-ray with chest which has resulted and reveals  1. No acute rib fracture identified. 2. Patchy opacity within the left mid lung zone, new from prior examination as well as  prior PET CT examination and may represent a developing infectious or inflammatory infiltrate.   I have personally reviewed and interpreted this imaging and agree with radiology interpretation.  Medications / Treatment: Norco and lidocaine patches for pain   Disposition: After consideration of the diagnostic results and the patients response to treatment, I feel that emergency department workup does not suggest an emergent condition requiring admission or immediate intervention beyond what has been performed at this time. The plan is: Discharge with incentive spirometry and close outpatient follow-up with return precautions.  Patient is able to walk through the department  without oxygen saturation that drops below 91%.  I did discuss the x-ray findings with him, he does state he has had a more productive cough for the last few days.  He denies any fevers or chills or shortness of breath.  However given his history and x-ray findings will cover for pneumonia with Augmentin and azithromycin.  According to respiratory therapy he did well with the incentive spirometer.  I did discuss given his comorbid medical conditions additional evaluation with labs and potential admission to the hospital, however patient would prefer to go home with antibiotics and pain medication which is reasonable given that he can walk without getting short of breath or dropping his oxygen saturation.  He does unfortunately still smokes cigarettes, education on smoking cessation discussed.  Given his pain, will also send for a few doses of Vicodin.  PDMP reviewed.  Patient advised not to drive or operate heavy machinery while taking this medication.  Also advised on the increased fall risk related to narcotic medication.  Patient expressed understanding and is in agreement with this.  Evaluation and diagnostic testing in the emergency department does not suggest an emergent condition requiring admission or immediate intervention beyond  what has been performed at this time.  Plan for discharge with close PCP follow-up.  Patient is understanding and amenable with plan, educated on red flag symptoms that would prompt immediate return.  Patient discharged in stable condition.  Findings and plan of care discussed with supervising physician Dr. Rush Landmark who is in agreement.    Final Clinical Impression(s) / ED Diagnoses Final diagnoses:  Fall, initial encounter  Community acquired pneumonia of left lower lobe of lung  Rib pain on left side    Rx / DC Orders ED Discharge Orders          Ordered    HYDROcodone-acetaminophen (NORCO/VICODIN) 5-325 MG tablet  Every 6 hours PRN        07/13/23 2202    lidocaine (LIDODERM) 5 %  Every 24 hours        07/13/23 2202    amoxicillin-clavulanate (AUGMENTIN) 875-125 MG tablet  Every 12 hours        07/13/23 2202    azithromycin (ZITHROMAX) 250 MG tablet  Daily        07/13/23 2202          An After Visit Summary was printed and given to the patient.     Vear Clock 07/13/23 2206    Tegeler, Canary Brim, MD 07/13/23 2237

## 2023-07-13 NOTE — ED Notes (Signed)
Patient resting quietly in stretcher, respirations even, unlabored, no acute distress noted. Denies needs at this time.  

## 2023-07-13 NOTE — ED Triage Notes (Addendum)
Fell backward off first of three steps. Uses cane to ambulate. Now complains of lower back pain, skin intact in this area, no bruising seen, +tenderness. No thinners. No trauma to head. No pain in neck or T spine. No CP, SOB. No LOC. No radiation to leg, no numbness. HX lung cancer.

## 2023-07-13 NOTE — Discharge Instructions (Signed)
As we discussed, x-ray imaging did not reveal a rib fracture.  However it did show an area on the left side where you are having symptoms that could be inflammatory from trauma or it could be an infection.  Given your medical history, we have opted to go ahead and treat for any infectious causes.  I have given you 2 antibiotics that you should fill and take as prescribed in its entirety for management of this.  Additionally, you should do your incentive spirometry every day multiple times a day to help clear this infection.  The respiratory therapist has taught you how to do this.  Please follow her instructions.  Additionally, I have given you a prescription for Vicodin which is a narcotic pain medication for you to take as prescribed as needed for severe pain only.  Do not drive or operate heavy machinery while taking this medication as it can be sedating and please be advised that it does increase your fall risk and exercise extreme caution while taking the instruments.  Additionally I have given you a prescription for lidocaine patch to wear on the area that is tender.  Please do so as prescribed.  Please call your doctor and schedule a follow-up appointment at your earliest convenience  Return if development of any new or worsening symptoms.

## 2023-07-13 NOTE — ED Notes (Signed)
RT educated pt on smoking cessation. Discussed w/pt options for quitting and resources available through PCP. Pt states he knows he needs to quit and should probably do so in the near future.

## 2023-07-13 NOTE — ED Notes (Signed)
Report received from [place], [place], patient resting in stretcher, respirations even, unlabored, no acute distress noted. PA Sarah at bedside for evaluation

## 2023-07-13 NOTE — ED Notes (Signed)
RT educated pt on the proper use of IS. Pt able to perform above goal of 1250 mLs to 1500 mLs. Pt verbalizes understanding of teaching and able to teach back to RT.   07/13/23 2135  Incentive Spirometry Tx  Level of Service Assisted by RCP  Frequency q1hr W/A  Treatment Tolerance Tolerated well  IS Goal (mL) (RN or RT) 1250 mL  IS - Achieved (mL) (RN, NT, or RT) 1500 mL

## 2023-07-13 NOTE — ED Notes (Signed)
RT ambulated pt w/pulse ox. Prior to ambulation pt sats on RA 94%. Pt sats during ambulation fluctuated between 91%-94%. Post ambulation sats 95%. Pt respiratory status stable on RA w/no distress noted at this time. Pt BLBS clr/dim.  07/13/23 2115  Vitals  Pulse Rate 88  Pulse Rate Source Monitor  Resp 18  SpO2 95 %

## 2023-07-13 NOTE — ED Notes (Signed)
Lights dimmed for patient comfort, awaiting XR.

## 2023-07-13 NOTE — ED Notes (Signed)
RN reviewed discharge instructions with pt. Pt verbalized understanding and had no further questions. VSS upon discharge.  

## 2023-07-15 DIAGNOSIS — F324 Major depressive disorder, single episode, in partial remission: Secondary | ICD-10-CM | POA: Insufficient documentation

## 2023-07-18 ENCOUNTER — Other Ambulatory Visit: Payer: Self-pay | Admitting: Family Medicine

## 2023-07-18 DIAGNOSIS — F17200 Nicotine dependence, unspecified, uncomplicated: Secondary | ICD-10-CM

## 2023-08-01 ENCOUNTER — Other Ambulatory Visit: Payer: Self-pay | Admitting: Family Medicine

## 2023-08-01 ENCOUNTER — Ambulatory Visit
Admission: RE | Admit: 2023-08-01 | Discharge: 2023-08-01 | Disposition: A | Discharge status: Home / Self Care | Payer: Medicare Other | Source: Ambulatory Visit | Attending: Family Medicine | Admitting: Family Medicine

## 2023-08-01 DIAGNOSIS — F17200 Nicotine dependence, unspecified, uncomplicated: Secondary | ICD-10-CM

## 2023-08-01 DIAGNOSIS — J189 Pneumonia, unspecified organism: Secondary | ICD-10-CM

## 2023-08-02 ENCOUNTER — Other Ambulatory Visit: Payer: Self-pay | Admitting: Family Medicine

## 2023-08-02 ENCOUNTER — Ambulatory Visit
Admission: RE | Admit: 2023-08-02 | Discharge: 2023-08-02 | Disposition: A | Discharge status: Home / Self Care | Payer: Medicare Other | Source: Ambulatory Visit | Attending: Family Medicine | Admitting: Family Medicine

## 2023-08-02 DIAGNOSIS — J189 Pneumonia, unspecified organism: Secondary | ICD-10-CM

## 2024-03-16 DIAGNOSIS — Z860100 Personal history of colon polyps, unspecified: Secondary | ICD-10-CM | POA: Insufficient documentation

## 2024-03-16 DIAGNOSIS — K219 Gastro-esophageal reflux disease without esophagitis: Secondary | ICD-10-CM | POA: Insufficient documentation

## 2024-04-02 DIAGNOSIS — I2699 Other pulmonary embolism without acute cor pulmonale: Secondary | ICD-10-CM | POA: Insufficient documentation

## 2024-04-02 NOTE — Consults (Signed)
 ------------------------------------------------------------------------------- Attestation signed by Shanna JINNY Denmark, MD at 04/02/2024  2:27 PM This is a shared visit with the APP.  I have seen and examined the patient and developed the plan. I reviewed the medical records. Exam : Well developed, well nourished.  In no acute distress  HEENT: Normocephalic, atraumatic. No scleral icterus.  No oropharyngeal lesions, mucosa pink and moist. No LN felt. Neck: Normal to inspection. Supple. No LN felt. Lungs:Respiratory effort unlabored. Clear to auscultation  Cardiac: RRR, no murmurs rubs or gallops Abdomen:  Active bowel sounds, not distended, no tenderness,no r/g Extremities : no edema or lesions seen, normal pedal pulses. Musculoskeletal: normal ROM Neurological: Alert and oriented x 3.No gross motor deficits Psych : Appropriate mood and affect  Patient with melena that started a few weeks ago even before starting Eliquis, denies hematochezia or vomiting blood . Labs with significant anemia. I agree with transfusion , PPI IV and proceeding with EGD to ro PUD, Ca..... -------------------------------------------------------------------------------  GASTROENTEROLOGY INPATIENT CONSULTATION NOTE  REFFERING PHYSICIAN: Leita Phenix, MD;Hinna Ma*  CC:  bloody stools and anemia   HISTORY OF PRESENT ILLNESS: Jerry Dise. is a 69 y.o.male with a significant PMHx including lung cancer undergoing chemotx, who was admitted to [facility] on 04/02/2024 with gib w/ severe anemia.  Our office has never seen this patient.    As per ER provider's HPI, Jerry Wall is a 69 y.o. male who presents  due to HGB: 6.4 per labs today at MD office. He complains of shortness of breath and dizziness onset 2 weeks ago. He also reports chest pain radiating to back.  He states he went to [facility] office for lab work and chemo infusion. Patient with history of advanced lung cancer. His last chemo infusion was 3 weeks ago.  He also  reports blood in his stool intermittently over the past month. He was also recently diagnosed with a blood clot.  He also complains of fatigue.    Patient also with history of hyperlipidemia.  He has not eaten today. -had coffee at 3am -to be given 1 unit prbcs -hgb down 2g in 2 weeks since Eliquis started   The patient reports he started having black appearing stools about a month ago.  This was prior to going on Eliquis.  At that time he was taking a baby aspirin 81 mg.  He stopped his aspirin a couple weeks ago after going on Eliquis when diagnosed with a PE.  He states the dark stools have been intermittent since that time.  It happened again about 2 weeks ago and lasted for 2 to 3 days during which time he had multiple stools that were formed but not diarrhea.  He states recently he has had brown stool and that at the oncology office today he was checked and told that there was no visible blood.  He denies seeing any red blood in the stool, any abdominal pain, any trouble swallowing solids, or any heartburn.  He is on omeprazole over-the-counter and reports this controls his symptoms well.  He denies any recent significant weight loss, nausea, vomiting, chest pain, or shortness of breath at rest.  Over the last 2 weeks he has noticed dyspnea on exertion dizziness and weakness.  He has not been diagnosed with peptic ulcer disease in the past.  His last colonoscopy was in 2024 with Dr. Kristie who he states polyps were found.  He is to have another colonoscopy with her in early June.  He last took Eliquis  at 3 AM this morning.   COLONOSCOPY   08/10/2013     Procedure: COLONOSCOPY; Dr. Kristie- Repeat in 5 years     Iron/b12 pending.   INR 1.3.    Component Ref Range & Units (hover) 08:47 (04/02/24) 2 wk ago (03/19/24) 3 wk ago (03/12/24) 1 mo ago (02/20/24) 2 mo ago (01/30/24) 2 mo ago (01/09/24) 3 mo ago (12/12/23)  WBC 11.12 High  3.94 <redacted file path> Low <redacted file path>  16.19  <redacted file path> High <redacted file path>  17.36 <redacted file path> High <redacted file path>  17.02 <redacted file path> High <redacted file path>  18.71 <redacted file path> High <redacted file path>  11.15 <redacted file path> High <redacted file path>   RBC 1.81 Low  2.59 <redacted file path> Low <redacted file path>  2.49 <redacted file path> Low <redacted file path>  3.19 <redacted file path> Low <redacted file path>  3.19 <redacted file path> Low <redacted file path>  3.25 <redacted file path> Low <redacted file path>  3.00 <redacted file path> Low <redacted file path>   Hemoglobin 6.4 Low Panic  8.6 <redacted file path> Low <redacted file path>  8.4 <redacted file path> Low <redacted file path>  10.3 <redacted file path> Low <redacted file path>  10.8 <redacted file path> Low <redacted file path>  11.3 <redacted file path> Low <redacted file path>  10.8 <redacted file path> Low <redacted file path>   Hematocrit 19.2 Low  26.4 <redacted file path> Low <redacted file path>  25.1 <redacted file path> Low <redacted file path>  31.9 <redacted file path> Low <redacted file path>  32.1 <redacted file path> Low <redacted file path>  33.7 <redacted file path> Low <redacted file path>  32.3 <redacted file path> Low <redacted file path>   Mean Corpuscular Volume (MCV) 105.6 High  101.9 <redacted file path> High <redacted file path>  101.1 <redacted file path> High <redacted file path>  100.0 <redacted file path> High <redacted file path>  100.5 <redacted file path> High <redacted file path>  103.6 <redacted file path> High <redacted file path>  107.8 <redacted file path> High <redacted file path>   Mean Corpuscular Hemoglobin (MCH) 35.1 High  33.2 <redacted file path> 33.8 <redacted file path> High <redacted file path>  32.3 <redacted file path> 33.8 <redacted file path> High <redacted file path>  34.9 <redacted file path> High <redacted file path>  36.1 <redacted file path> High <redacted file path>    Mean Corpuscular Hemoglobin Conc (MCHC) 33.3 32.6 <redacted file path> Low <redacted file path>  33.5 <redacted file path> 32.3 <redacted file path> Low <redacted file path>  33.6 <redacted file path> 33.7 <redacted file path> 33.5 <redacted file path>  Red Cell Distribution Width (RDW) 25.0 High  19.2 <redacted file path> High <redacted file path>  18.5 <redacted file path> High <redacted file path>  18.1 <redacted file path> High <redacted file path>  16.5 <redacted file path> 16.4 <redacted file path> 17.2 <redacted file path> High <redacted file path>   Platelet Count (PLT) 591 High  255 <redacted file path> 642 <redacted file path> High <redacted file path>  591 <redacted file path> High <redacted file path>  587 <redacted file path> High <redacted file path>  496 <redacted file path> High <redacted file path>  541 <redacted file path> High <redacted file path>     VQ scan 03/19/24 IMPRESSION: Unmatched perfusion defect in RIGHT upper lobe is most concerning for acute pulmonary embolism.  ALLERGIES: Patient has no known allergies.  HOME MEDICATIONS: Prior to Admission medications   Medication Sig Start Date End Date Taking? Authorizing Provider  amLODIPine (NORVASC) 5 mg tablet TAKE 1 TABLET(5 MG) BY MOUTH DAILY 02/24/24   Natalie Wilson James, PA-C  apixaban (ELIQUIS) 5 mg tab Take 1 tablet (5 mg total) by mouth 2 (two) times a day. 03/25/24   Zachary Elza Slocumb, MD  aspirin 81 mg EC tablet Take 81 mg by mouth Once Daily. 04/22/20   HISTORICAL PROVIDER, CONVERSION  cholecalciferol (VITAMIN D3) 1,000 unit (25 mcg) tablet Take 1,000 Units by mouth daily.    HISTORICAL PROVIDER, CONVERSION  cyanocobalamin (VITAMIN B12) 1,000 mcg tablet Take 1,000 mcg by mouth Once Daily. 02/10/19   HISTORICAL PROVIDER, CONVERSION  escitalopram (LEXAPRO) 10 mg tablet TAKE 1 TABLET(10 MG) BY MOUTH DAILY 03/20/24   Natalie Wilson James, PA-C  folic acid  (FOLVITE ) 1 mg tablet TAKE 1 TABLET(1 MG) BY MOUTH DAILY  03/10/24   Zachary Elza Slocumb, MD  losartan (COZAAR) 100 mg tablet TAKE 1 TABLET(100 MG) BY MOUTH DAILY 02/18/24   Natalie Wilson James, PA-C  nystatin (MYCOSTATIN) 100,000 unit/mL suspension Take 5 mL (500,000 Units total) by mouth 4 (four) times a day. 03/23/24 04/22/24  Zachary Elza Slocumb, MD  olopatadine (Pataday Twice Daily Relief) 0.1 % ophthalmic solution Administer 1 drop into each eyes 2 (two) times a day. 01/15/24 01/14/25  Laneta Tanda Agent, PA-C  omeprazole (PriLOSEC) 20 mg DR capsule Take 20 mg by mouth in the morning.    HISTORICAL PROVIDER, CONVERSION  ondansetron  (ZOFRAN -ODT) 8 mg disintegrating tablet Take 8 mg by mouth every 8 (eight) hours as needed for nausea. 10/25/22   Zachary Elza Slocumb, MD  rosuvastatin (CRESTOR) 5 mg tablet TAKE 1 TABLET(5 MG) BY MOUTH EVERY NIGHT 02/24/24   Natalie Wilson James, PA-C  tadalafiL (CIALIS) 20 mg tablet Take 1/2 tablet as needed for erectile dysfunction.  Take 30 to 60 minutes prior to sexual activity.  May increase to 1 whole tablet as needed, but decreased to 1/2 tablet if headache or dizziness occurs. 07/15/23   Natalie Wilson James, PA-C    CURRENT HOSPITAL MEDICATIONS:  Current Facility-Administered Medications:  .  acetaminophen  (TYLENOL ) suppository 650 mg, 650 mg, rectal, Q6H PRN, Ladonna Josey, ACNP .  dextrose  (D50W) 50 % injection 12.5 g, 12.5 g, intravenous, PRN, Ladonna Josey, ACNP .  dextrose  (GLUTOSE) 40 % oral gel 15 g, 15 g, oral, PRN, Ladonna Josey, ACNP .  sodium chloride  0.9 % infusion, 30 mL/hr, intravenous, Continuous PRN, Leita Phenix, MD .  sodium chloride  0.9 % infusion, 75 mL/hr, intravenous, Continuous, Ladonna Josey, ACNP  Current Outpatient Medications:  .  amLODIPine (NORVASC) 5 mg tablet, TAKE 1 TABLET(5 MG) BY MOUTH DAILY, Disp: 90 tablet, Rfl: 0 .  apixaban (ELIQUIS) 5 mg tab, Take 1 tablet (5 mg total) by mouth 2 (two) times a day., Disp: 60 tablet, Rfl: 11 .  cholecalciferol (VITAMIN D3) 1,000 unit  (25 mcg) tablet, Take 1,000 Units by mouth daily., Disp: , Rfl:  .  cyanocobalamin (VITAMIN B12) 1,000 mcg tablet, Take 1,000 mcg by mouth Once Daily., Disp: , Rfl:  .  escitalopram (LEXAPRO) 10 mg tablet, TAKE 1 TABLET(10 MG) BY MOUTH DAILY, Disp: 90 tablet, Rfl: 0 .  folic acid  (FOLVITE ) 1 mg tablet, TAKE 1 TABLET(1 MG) BY MOUTH DAILY, Disp: 30 tablet, Rfl: 3 .  losartan (COZAAR) 100 mg tablet, TAKE 1 TABLET(100 MG) BY MOUTH DAILY, Disp: 90 tablet, Rfl: 0 .  nystatin (MYCOSTATIN) 100,000 unit/mL suspension,  Take 5 mL (500,000 Units total) by mouth 4 (four) times a day., Disp: 280 mL, Rfl: 5 .  omeprazole (PriLOSEC) 20 mg DR capsule, Take 20 mg by mouth in the morning., Disp: , Rfl:  .  rosuvastatin (CRESTOR) 5 mg tablet, TAKE 1 TABLET(5 MG) BY MOUTH EVERY NIGHT, Disp: 90 tablet, Rfl: 0 .  olopatadine (Pataday Twice Daily Relief) 0.1 % ophthalmic solution, Administer 1 drop into each eyes 2 (two) times a day., Disp: 5 mL, Rfl: 2 .  ondansetron  (ZOFRAN -ODT) 8 mg disintegrating tablet, Take 8 mg by mouth every 8 (eight) hours as needed for nausea., Disp: 20 tablet, Rfl: 1 .  tadalafiL (CIALIS) 20 mg tablet, Take 1/2 tablet as needed for erectile dysfunction.  Take 30 to 60 minutes prior to sexual activity.  May increase to 1 whole tablet as needed, but decreased to 1/2 tablet if headache or dizziness occurs., Disp: 30 tablet, Rfl: 3  PAST MEDICAL HISTORY: Past Medical History:  Diagnosis Date  . Depression   . Elevated ALT measurement 11/09/2016  . Erectile dysfunction 01/02/2018  . GERD (gastroesophageal reflux disease)   . Hyperlipidemia 05/16/2016  . Kidney stone    history  . Leukocytosis 06/27/2018  . Lung cancer     . 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 Multiple lung nodules on CT 02/10/2020  . Prediabetes 11/09/2016  . Snores   . 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 Tobacco use disorder 06/20/2016  . Wears dentures    UPPER ONLY  . Wears  glasses     PAST SURGICAL HISTORY: Past Surgical History:  Procedure Laterality Date  . CARDIAC CATHETERIZATION     Procedure: CARDIAC CATHETERIZATION  . COLONOSCOPY  08/10/2013   Procedure: COLONOSCOPY; Dr. Kristie- Repeat in 5 years  . LUNG LOBECTOMY Right 03/23/2020   Procedure: THORACOTOMY / LOBECTOMY;  Surgeon: Lamar Curtistine Server, DO;  Location: [facility] MAIN OR;  Service: Cardiothoracic;  Laterality: Right;  . LUNG SURGERY Right 03/23/2020   Procedure: WEDGE RESECTION ROBOTIC;  Surgeon: Lamar Curtistine Server, DO;  Location: [facility] MAIN OR;  Service: Cardiothoracic;  Laterality: Right;    SOCIAL HISTORY: Social History   Tobacco Use  Smoking Status Every Day  . Current packs/day: 0.50  . Types: Cigarettes  . Passive exposure: Current  Smokeless Tobacco Former   Social History   Substance and Sexual Activity  Alcohol  Use Not Currently  . Alcohol /week: 35.0 standard drinks of alcohol    Social History   Substance and Sexual Activity  Drug Use No    FAMILY HISTORY: Negative for colon cancer  REVIEW OF SYSTEMS: A complete ROS of negative except those stated in HPI.  LABS: Lab Results  Component Value Date/Time   WBC 11.12 (H) 04/02/2024 0847   RBC 1.81 (L) 04/02/2024 0847   HGB 6.4 (LL) 04/02/2024 0847   HCT 19.2 (L) 04/02/2024 0847   MCV 105.6 (H) 04/02/2024 0847   PLT 591 (H) 04/02/2024 0847   Lab Results  Component Value Date/Time   NA 137 04/02/2024 0847   K 4.2 04/02/2024 0847   CL 107 04/02/2024 0847   CO2 21 04/02/2024 0847   BUN 17 04/02/2024 0847   CREATININE 1.91 (H) 04/02/2024 0847   CALCIUM 8.7 04/02/2024 0847   PROT 6.0 (L) 04/02/2024 0847   ALBUMIN  3.4 (L) 04/02/2024 0847   BILITOT 0.3 04/02/2024 0847   AST 20 04/02/2024 0847   ALT 20 04/02/2024 0847  ANIONGAP 9 04/02/2024 0847   Lab Results  Component Value Date   INR 1.3 04/02/2024   PROTIME 15.8 (H) 04/02/2024   No results found for: LIPASE No results found for:  TROPHS  IMAGING:  Pertinent GI imaging per HPI  VITAL SIGNS:  Vitals:   04/02/24 1037  BP: 135/76  Pulse: 87  Resp: 12  Temp: 97.9 F (36.6 C)  SpO2: 99%    PHYSICAL EXAM: Well developed, well nourished.  No acute distress.   HEENT: Normocephalic, atraumatic. No scleral icterus.  No oropharyngeal lesions, mucosa pink and moist Neck: Normal to inspection. Supple Lungs:Respiratory effort unlabored. Clear to auscultation  Cardiac: Rhythm regular with no murmur Abdomen:  Active bowel sounds, soft, nondistended, nontender  Extremities without edema. Musculoskeletal:  No gross motor deficits Neurological: Alert and oriented x 3. Mood and affect appropriate.  ASSESSMENT  Gi bleeding-h/o black stool-denies any black stool today  Subacute blood loss anemia  Recent PE-started on Eliquis  PLAN Recommend egd to eval for PUD, AVMs, neoplasm, Cameron's ulcer or other  I discussed the nature of the recommended EGD as well as the indications, risks, alternatives and potential complications including, but not limited to, bleeding, infection, reaction to medication, damage to internal organs, cardiac and/or pulmonary problems, and perforation requiring surgery (1 to 2 in 1000). The possibility that significant findings could be missed was explained. Any questions the patient had were answered. The patient gives consent for the procedure.  Thank you for allowing us  to participate in the care of this patient.   Case to be discussed with Rami J. Badreddine, MD who will also provide further assessment/plan (see very top or very bottom of this note for their documentation)

## 2024-05-04 DIAGNOSIS — E538 Deficiency of other specified B group vitamins: Secondary | ICD-10-CM | POA: Insufficient documentation

## 2024-05-31 ENCOUNTER — Emergency Department (HOSPITAL_COMMUNITY)

## 2024-05-31 ENCOUNTER — Emergency Department (HOSPITAL_COMMUNITY)
Admission: EM | Admit: 2024-05-31 | Discharge: 2024-05-31 | Disposition: A | Discharge status: Home / Self Care | Attending: Emergency Medicine | Admitting: Emergency Medicine

## 2024-05-31 ENCOUNTER — Other Ambulatory Visit: Payer: Self-pay

## 2024-05-31 DIAGNOSIS — R410 Disorientation, unspecified: Secondary | ICD-10-CM | POA: Diagnosis present

## 2024-05-31 DIAGNOSIS — C3490 Malignant neoplasm of unspecified part of unspecified bronchus or lung: Secondary | ICD-10-CM | POA: Diagnosis not present

## 2024-05-31 DIAGNOSIS — D72829 Elevated white blood cell count, unspecified: Secondary | ICD-10-CM | POA: Insufficient documentation

## 2024-05-31 DIAGNOSIS — Z7901 Long term (current) use of anticoagulants: Secondary | ICD-10-CM | POA: Diagnosis not present

## 2024-05-31 LAB — DIFFERENTIAL
Abs Immature Granulocytes: 4.36 10*3/uL — ABNORMAL HIGH (ref 0.00–0.07)
Basophils Absolute: 0 10*3/uL (ref 0.0–0.1)
Basophils Relative: 0 %
Eosinophils Absolute: 0 10*3/uL (ref 0.0–0.5)
Eosinophils Relative: 0 %
Immature Granulocytes: 9 %
Lymphocytes Relative: 1 %
Lymphs Abs: 0.4 10*3/uL — ABNORMAL LOW (ref 0.7–4.0)
Monocytes Absolute: 0.2 10*3/uL (ref 0.1–1.0)
Monocytes Relative: 1 %
Neutro Abs: 46 10*3/uL — ABNORMAL HIGH (ref 1.7–7.7)
Neutrophils Relative %: 89 %

## 2024-05-31 LAB — CBC
HCT: 31.3 % — ABNORMAL LOW (ref 39.0–52.0)
Hemoglobin: 10.1 g/dL — ABNORMAL LOW (ref 13.0–17.0)
MCH: 35.4 pg — ABNORMAL HIGH (ref 26.0–34.0)
MCHC: 32.3 g/dL (ref 30.0–36.0)
MCV: 109.8 fL — ABNORMAL HIGH (ref 80.0–100.0)
Platelets: 198 10*3/uL (ref 150–400)
RBC: 2.85 MIL/uL — ABNORMAL LOW (ref 4.22–5.81)
RDW: 18.5 % — ABNORMAL HIGH (ref 11.5–15.5)
WBC: 50.7 10*3/uL (ref 4.0–10.5)
nRBC: 0 % (ref 0.0–0.2)

## 2024-05-31 LAB — COMPREHENSIVE METABOLIC PANEL WITH GFR
ALT: 35 U/L (ref 0–44)
AST: 28 U/L (ref 15–41)
Albumin: 3.5 g/dL (ref 3.5–5.0)
Alkaline Phosphatase: 104 U/L (ref 38–126)
Anion gap: 11 (ref 5–15)
BUN: 41 mg/dL — ABNORMAL HIGH (ref 8–23)
CO2: 19 mmol/L — ABNORMAL LOW (ref 22–32)
Calcium: 8.4 mg/dL — ABNORMAL LOW (ref 8.9–10.3)
Chloride: 106 mmol/L (ref 98–111)
Creatinine, Ser: 2.16 mg/dL — ABNORMAL HIGH (ref 0.61–1.24)
GFR, Estimated: 33 mL/min — ABNORMAL LOW (ref >60)
Glucose, Bld: 109 mg/dL — ABNORMAL HIGH (ref 70–99)
Potassium: 4 mmol/L (ref 3.5–5.1)
Sodium: 136 mmol/L (ref 135–145)
Total Bilirubin: 0.8 mg/dL (ref 0.0–1.2)
Total Protein: 6.8 g/dL (ref 6.5–8.1)

## 2024-05-31 LAB — URINALYSIS, ROUTINE W REFLEX MICROSCOPIC
Bacteria, UA: NONE SEEN
Bilirubin Urine: NEGATIVE
Glucose, UA: NEGATIVE mg/dL
Ketones, ur: NEGATIVE mg/dL
Leukocytes,Ua: NEGATIVE
Nitrite: NEGATIVE
Protein, ur: NEGATIVE mg/dL
Specific Gravity, Urine: 1.015 (ref 1.005–1.030)
pH: 5 (ref 5.0–8.0)

## 2024-05-31 LAB — TROPONIN I (HIGH SENSITIVITY)
Troponin I (High Sensitivity): 14 ng/L (ref <18)
Troponin I (High Sensitivity): 15 ng/L (ref <18)

## 2024-05-31 LAB — PROTIME-INR
INR: 1.5 — ABNORMAL HIGH (ref 0.8–1.2)
Prothrombin Time: 17.8 s — ABNORMAL HIGH (ref 11.4–15.2)

## 2024-05-31 LAB — CBG MONITORING, ED: Glucose-Capillary: 101 mg/dL — ABNORMAL HIGH (ref 70–99)

## 2024-05-31 LAB — D-DIMER, QUANTITATIVE: D-Dimer, Quant: 1.36 ug{FEU}/mL — ABNORMAL HIGH (ref 0.00–0.50)

## 2024-05-31 LAB — LIPASE, BLOOD: Lipase: 36 U/L (ref 11–51)

## 2024-05-31 MED ORDER — IOHEXOL 350 MG/ML SOLN
75.0000 mL | Freq: Once | INTRAVENOUS | Status: AC | PRN
  Administered 2024-05-31: 75 mL via INTRAVENOUS

## 2024-05-31 MED ORDER — HEPARIN SOD (PORK) LOCK FLUSH 100 UNIT/ML IV SOLN
INTRAVENOUS | Status: AC
  Filled 2024-05-31: qty 5

## 2024-05-31 MED ORDER — SODIUM CHLORIDE 0.9 % IV BOLUS
1000.0000 mL | Freq: Once | INTRAVENOUS | Status: AC
  Administered 2024-05-31: 1000 mL via INTRAVENOUS

## 2024-05-31 NOTE — Discharge Instructions (Addendum)
 Today you were seen for increased confusion.  I suspect this is likely due to poor oral intake.  Please try to maintain adequate hydration.  Please follow-up with your oncologist as soon as possible.  Please return to the ED if you have worsening symptoms, uncontrollable vomiting or fever.  Thank you for letting us  treat you today. After reviewing your labs and imaging, I feel you are safe to go home. Please follow up with your PCP in the next several days and provide them with your records from this visit. Return to the Emergency Room if pain becomes severe or symptoms worsen.

## 2024-05-31 NOTE — ED Notes (Signed)
Bladder scan 132 ml

## 2024-05-31 NOTE — ED Provider Notes (Signed)
 [place] EMERGENCY DEPARTMENT AT [facility] Provider Note   CSN: [id number] Arrival date & time: 05/31/24  1710     History  Chief Complaint  Patient presents with   Altered Mental Status    CHANAN DETWILER is a 69 y.o. male presents today for confusion that began yesterday.  Patient had chemo for lung cancer on 5/30 and has not been eating or drinking well since.  Patient states he fell and hit his right elbow but denies any pain.  Patient also reports a syncopal episode but is unsure of when.  Patient denies nausea, vomiting, fever, chills, or diarrhea.  Patient does state he has shortness of breath but this is not uncommon considering he has lung cancer, and mild chest pain that began a few hours prior to arrival.  Patient is anticoagulated on Eliquis and believes he may have accidentally taken 2 yesterday instead of 1.   Altered Mental Status Presenting symptoms: confusion        Home Medications Prior to Admission medications   Medication Sig Start Date End Date Taking? Authorizing Provider  amLODipine (NORVASC) 5 MG tablet Take 5 mg by mouth daily.   Yes [provider]  dexamethasone (DECADRON) 4 MG tablet Take 8 mg by mouth as directed. 05/19/24  Yes [provider]  ELIQUIS 5 MG TABS tablet Take 5 mg by mouth 2 (two) times daily.   Yes [provider]  ferrous sulfate 325 (65 FE) MG tablet Take 325 mg by mouth daily with breakfast. 04/05/24 07/04/24 Yes [provider]  folic acid  (FOLVITE ) 1 MG tablet Take 1 mg by mouth daily. 03/10/24  Yes [provider]  losartan (COZAAR) 100 MG tablet Take 100 mg by mouth daily. 08/14/22  Yes [provider]  meloxicam (MOBIC) 15 MG tablet Take 15 mg by mouth daily. 02/12/18  Yes [provider]  nystatin (MYCOSTATIN) 100000 UNIT/ML suspension Use as directed 5 mLs in the mouth or throat 4 (four) times daily. 05/19/24  Yes [provider]  pantoprazole (PROTONIX)  40 MG tablet Take 40 mg by mouth daily. 04/10/24  Yes [provider]  rosuvastatin (CRESTOR) 5 MG tablet Take 5 mg by mouth daily. 01/02/18  Yes [provider]  traMADol (ULTRAM) 50 MG tablet Take 50 mg by mouth every 6 (six) hours as needed for moderate pain (pain score 4-6). 05/28/24  Yes [provider]  amoxicillin -clavulanate (AUGMENTIN ) 875-125 MG tablet Take 1 tablet by mouth every 12 (twelve) hours. Patient not taking: Reported on 05/31/2024 07/13/23   Smoot, Genevive Ket, PA-C  azithromycin  (ZITHROMAX ) 250 MG tablet Take 1 tablet (250 mg total) by mouth daily. Take first 2 tablets together, then 1 every day until finished. Patient not taking: Reported on 05/31/2024 07/13/23   Smoot, Genevive Ket, PA-C  doxycycline (VIBRA-TABS) 100 MG tablet Take 100 mg by mouth 2 (two) times daily. Patient not taking: Reported on 05/31/2024 05/08/24   [provider]  HYDROcodone -acetaminophen  (NORCO/VICODIN) 5-325 MG tablet Take 1 tablet by mouth every 6 (six) hours as needed for severe pain. 07/13/23   Smoot, Sarah A, PA-C  lidocaine  (LIDODERM ) 5 % Place 1 patch onto the skin daily. Remove & Discard patch within 12 hours or as directed by MD Patient not taking: Reported on 05/31/2024 07/13/23   Smoot, Genevive Ket, PA-C  sildenafil (VIAGRA) 100 MG tablet Take 100 mg by mouth as needed. 01/02/18   [provider]      Allergies  Patient has no known allergies.    Review of Systems   Review of Systems  Psychiatric/Behavioral:  Positive for confusion.     Physical Exam Updated Vital Signs BP (!) 143/87   Pulse (!) 101   Temp 98.2 F (36.8 C) (Axillary)   Resp 17   SpO2 98%  Physical Exam Vitals and nursing note reviewed.  Constitutional:      General: He is not in acute distress.    Appearance: Normal appearance. He is well-developed. He is not ill-appearing, toxic-appearing or diaphoretic.  HENT:     Head: Normocephalic and atraumatic.     Right Ear: External ear normal.      Left Ear: External ear normal.     Nose: Nose normal.     Mouth/Throat:     Mouth: Mucous membranes are dry.  Eyes:     Extraocular Movements: Extraocular movements intact.     Conjunctiva/sclera: Conjunctivae normal.  Cardiovascular:     Rate and Rhythm: Regular rhythm. Tachycardia present.     Pulses: Normal pulses.     Heart sounds: Normal heart sounds. No murmur heard. Pulmonary:     Effort: Pulmonary effort is normal. No respiratory distress.     Breath sounds: Normal breath sounds.  Abdominal:     Palpations: Abdomen is soft.     Tenderness: There is no abdominal tenderness.  Musculoskeletal:        General: No swelling.     Cervical back: Normal range of motion and neck supple.  Skin:    General: Skin is warm and dry.     Capillary Refill: Capillary refill takes less than 2 seconds.     Comments: Patient with scrape to posterior right elbow.  Hemostatic on exam  Neurological:     General: No focal deficit present.     Mental Status: He is alert and oriented to person, place, and time.     Sensory: No sensory deficit.     Motor: No weakness.  Psychiatric:        Mood and Affect: Mood normal.     ED Results / Procedures / Treatments   Labs (all labs ordered are listed, but only abnormal results are displayed) Labs Reviewed  COMPREHENSIVE METABOLIC PANEL WITH GFR - Abnormal; Notable for the following components:      Result Value   CO2 19 (*)    Glucose, Bld 109 (*)    BUN 41 (*)    Creatinine, Ser 2.16 (*)    Calcium 8.4 (*)    GFR, Estimated 33 (*)    All other components within normal limits  CBC - Abnormal; Notable for the following components:   WBC 50.7 (*)    RBC 2.85 (*)    Hemoglobin 10.1 (*)    HCT 31.3 (*)    MCV 109.8 (*)    MCH 35.4 (*)    RDW 18.5 (*)    All other components within normal limits  URINALYSIS, ROUTINE W REFLEX MICROSCOPIC - Abnormal; Notable for the following components:   Hgb urine dipstick SMALL (*)    All other  components within normal limits  PROTIME-INR - Abnormal; Notable for the following components:   Prothrombin Time 17.8 (*)    INR 1.5 (*)    All other components within normal limits  D-DIMER, QUANTITATIVE - Abnormal; Notable for the following components:   D-Dimer, Quant 1.36 (*)    All other components within normal limits  DIFFERENTIAL - Abnormal; Notable for the  following components:   Neutro Abs 46.0 (*)    Lymphs Abs 0.4 (*)    Abs Immature Granulocytes 4.36 (*)    All other components within normal limits  CBG MONITORING, ED - Abnormal; Notable for the following components:   Glucose-Capillary 101 (*)    All other components within normal limits  LIPASE, BLOOD  TROPONIN I (HIGH SENSITIVITY)  TROPONIN I (HIGH SENSITIVITY)    EKG None  Radiology CT Angio Chest PE W and/or Wo Contrast Result Date: 05/31/2024 EXAM: CTA of the Chest with contrast for PE 05/31/2024 09:32:00 PM TECHNIQUE: CTA of the chest was performed after the administration of intravenous contrast. Multiplanar reformatted images are provided for review. MIP images are provided for review. Automated exposure control, iterative reconstruction, and/or weight based adjustment of the mA/kV was utilized to reduce the radiation dose to as low as reasonably achievable. COMPARISON: Chest radiograph earlier today and PET CT dated 05/11/2024. CLINICAL HISTORY: Pulmonary embolism (PE) suspected, low to intermediate prob, positive D-dimer. FINDINGS: PULMONARY ARTERIES: Pulmonary arteries are adequately opacified for evaluation. No evidence of pulmonary embolism. Main pulmonary artery is normal in caliber. MEDIASTINUM: The heart and pericardium demonstrate no acute abnormality. There is no acute abnormality of the thoracic aorta. Thoracic aortic atherosclerosis. LYMPH NODES: 2.7 cm short axis right paratracheal node (image 35) and additional 7 mm short axis right prevascular node, suspicious for nodal metastases. LUNGS AND PLEURA:  Platelet scarring/radiation changes in the left upper lobe. Status post right upper lobe wedge resection. Moderate centrilobular and paraseptal emphysematous changes, upper lung predominant. No focal consolidation or pulmonary edema. No evidence of pleural effusion or pneumothorax. UPPER ABDOMEN: Small hiatal hernia. SOFT TISSUES AND BONES: Right chest port terminates at the cavoatrial junction. No acute bone or soft tissue abnormality. IMPRESSION: 1. No evidence of pulmonary embolism. 2. Right paratracheal and prevascular nodal metastases. 3. Postprocedural changes in the bilateral upper lobes, as above. Electronically signed by: Zadie Herter MD 05/31/2024 09:39 PM EDT RP Workstation: [id number]   DG Elbow Complete Right Result Date: 05/31/2024 CLINICAL DATA:  Left elbow pain after a fall. EXAM: RIGHT ELBOW - COMPLETE 3+ VIEW COMPARISON:  None Available. FINDINGS: There is no evidence of fracture, dislocation, or joint effusion. There is no evidence of arthropathy or other focal bone abnormality. Soft tissues are unremarkable. IMPRESSION: Negative. Electronically Signed   By: Boyce Byes M.D.   On: 05/31/2024 19:23   DG Chest 2 View Result Date: 05/31/2024 CLINICAL DATA:  Chest pain.  Confusion.  Recent chemotherapy. EXAM: CHEST - 2 VIEW COMPARISON:  03/20/2024 FINDINGS: Power port type central venous catheter with tip over the cavoatrial junction region. Shallow inspiration. Heart size and pulmonary vascularity are normal. Left perihilar and upper lobe linear scarring is similar to prior study, likely representing treated neoplasm. No developing consolidation or airspace disease. No pleural effusion or pneumothorax. IMPRESSION: Left upper lung and perihilar scarring, likely post treatment changes. No change since prior study. No developing consolidation. Electronically Signed   By: Boyce Byes M.D.   On: 05/31/2024 19:22   CT Cervical Spine Wo Contrast Result Date: 05/31/2024 CLINICAL DATA:   Confusion since yesterday.  Chemotherapy patient. EXAM: CT HEAD WITHOUT CONTRAST CT CERVICAL SPINE WITHOUT CONTRAST TECHNIQUE: Multidetector CT imaging of the head and cervical spine was performed following the standard protocol without intravenous contrast. Multiplanar CT image reconstructions of the cervical spine were also generated. RADIATION DOSE REDUCTION: This exam was performed according to the departmental dose-optimization program which includes automated exposure  control, adjustment of the mA and/or kV according to patient size and/or use of iterative reconstruction technique. COMPARISON:  Brain MRI 04/14/2024 FINDINGS: CT HEAD FINDINGS Brain: No sign of acute stroke or mass lesion. 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 The ventricles are prominent, probably secondary to central atrophy. Normal pressure hydrocephalus not excluded but not favored. Chronic small-vessel ischemic changes also affect pons and hemispheric white matter diffusely. No hemorrhage or extra-axial collection. No sign of mass lesion. Vascular: There is atherosclerotic calcification of the major vessels at the base of the brain. Skull: Negative Sinuses/Orbits: Clear/normal Other: None CT CERVICAL SPINE FINDINGS Alignment: No malalignment. Skull base and vertebrae: No fracture or focal bone lesion. Chronic facet joint fusion at the C3-4 level. Soft tissues and spinal canal: Negative Disc levels: No significant finding at the foramen magnum, C1-2 or C2-3. C3-4: Chronic facet joint fusion. Sufficient patency of the canal and foramina. C4-5: Right-sided uncovertebral hypertrophy and facet arthropathy. Moderate right foraminal stenosis. C5-6: Unremarkable interspace. C6-7: Normal C7-T1: Mild facet arthritis.  No stenosis. Upper chest: Pleural and parenchymal scarring, incompletely evaluated. Other: None IMPRESSION: HEAD CT: 1. No acute  finding. Chronic small-vessel ischemic changes of the white matter and pons. 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 2. Prominent ventricles, probably secondary to central atrophy. Normal pressure hydrocephalus not excluded but not favored. CERVICAL SPINE CT: No acute or traumatic finding. Chronic facet joint fusion at C3-4. Degenerative changes at C4-5 with moderate right foraminal stenosis. Electronically Signed   By: Bettylou Brunner M.D.   On: 05/31/2024 18:35   CT Head Wo Contrast Result Date: 05/31/2024 CLINICAL DATA:  Confusion since yesterday.  Chemotherapy patient. EXAM: CT HEAD WITHOUT CONTRAST CT CERVICAL SPINE WITHOUT CONTRAST TECHNIQUE: Multidetector CT imaging of the head and cervical spine was performed following the standard protocol without intravenous contrast. Multiplanar CT image reconstructions of the cervical spine were also generated. RADIATION DOSE REDUCTION: This exam was performed according to the departmental dose-optimization program which includes automated exposure control, adjustment of the mA and/or kV according to patient size and/or use of iterative reconstruction technique. COMPARISON:  Brain MRI 04/14/2024 FINDINGS: CT HEAD FINDINGS Brain: No sign of acute stroke or mass lesion. 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 The ventricles are prominent, probably secondary to central atrophy. Normal pressure hydrocephalus not excluded but not favored. Chronic small-vessel ischemic changes also affect pons and hemispheric white matter diffusely. No hemorrhage or extra-axial collection. No sign of mass lesion. Vascular: There is atherosclerotic calcification of the major vessels at the base of the brain. Skull: Negative Sinuses/Orbits: Clear/normal Other: None CT CERVICAL SPINE FINDINGS Alignment: No malalignment. Skull base and vertebrae: No fracture or focal  bone lesion. Chronic facet joint fusion at the C3-4 level. Soft tissues and spinal canal: Negative Disc levels: No significant finding at the foramen magnum, C1-2 or C2-3. C3-4: Chronic facet joint fusion. Sufficient patency of the canal and foramina. C4-5: Right-sided uncovertebral hypertrophy and facet arthropathy. Moderate right foraminal stenosis. C5-6: Unremarkable interspace. C6-7: Normal C7-T1: Mild facet arthritis.  No stenosis. Upper chest: Pleural and parenchymal scarring, incompletely evaluated. Other: None IMPRESSION: HEAD CT: 1. No acute finding. Chronic small-vessel ischemic changes of the white matter and pons. 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 2. Prominent ventricles, probably  secondary to central atrophy. Normal pressure hydrocephalus not excluded but not favored. CERVICAL SPINE CT: No acute or traumatic finding. Chronic facet joint fusion at C3-4. Degenerative changes at C4-5 with moderate right foraminal stenosis. Electronically Signed   By: Bettylou Brunner M.D.   On: 05/31/2024 18:35    Procedures Procedures    Medications Ordered in ED Medications  sodium chloride 0.9 % bolus 1,000 mL (0 mLs Intravenous Stopped 05/31/24 2200)  iohexol (OMNIPAQUE) 350 MG/ML injection 75 mL (75 mLs Intravenous Contrast Given 05/31/24 2123)    ED Course/ Medical Decision Making/ A&P                                 Medical Decision Making Amount and/or Complexity of Data Reviewed Labs: ordered. Radiology: ordered.   This patient presents to the ED for concern of confusion, this involves an extensive number of treatment options, and is a complaint that carries with it a high risk of complications and morbidity.  The differential diagnosis includes electrolyte abnormality, hypoglycemia, STEMI, NSTEMI, arrhythmia, hyperglycemia, hypoxia   Co morbidities / Chronic conditions that complicate the patient evaluation  Lung cancer, anemia, stomach ulcer,  PE   Additional history obtained:  Additional history obtained from EMR External records from outside source obtained and reviewed including Care Everywhere Patient currently receiving Udenyca injections.   Lab Tests:  I Ordered, and personally interpreted labs.  The pertinent results include: Anemia at 10.1 which is chronic per historical values, elevated pro time and INR at 17.8 and 1.5, glucose 109, CMP with mildly decreased CO2 at 19, elevated bun at 41, elevated creatinine at 2.16 (chronic), troponin 14, 15, UA with small hemoglobin   Imaging Studies ordered:  I ordered imaging studies including chest x-ray which showed no change since prior study, no consolidation, right elbow x-ray which was negative, CT head and C-spine Noncon which showed no acute findings CTA PE which showed no evidence of PE I agree with the radiologist interpretation   Cardiac Monitoring: / EKG:  The patient was maintained on a cardiac monitor.  I personally viewed and interpreted the cardiac monitored which showed an underlying rhythm of: Sinus tach   Problem List / ED Course / Critical interventions / Medication management I ordered medication including NS 2L Reevaluation of the patient after these medicines showed that the patient Less tachycardic I have reviewed the patients home medicines and have made adjustments as needed Consult Oncology, Dr. Rosaline Coma who felt the patient's leukocytosis was likely due to his cancer treatment.  He did not feel that there was any emergent need for admission at this time and that close follow-up with his oncologist outpatient was a good disposition. Patient ambulatory with cane prior to discharge.  Test / Admission - Considered:  Considered for admission or further evaluation however patient's vital signs, physical exam, labs, and imaging of been reassuring.  Patient is tolerating p.o. intake at this time.  Patient given return precautions and to follow-up with his  oncologist as soon as possible.  I feel patient safe for discharge at this time.        Final Clinical Impression(s) / ED Diagnoses Final diagnoses:  Confusion    Rx / DC Orders ED Discharge Orders     None         Merryl Abraham 05/31/24 2249    Cheyenne Cotta, MD 06/01/24 1244

## 2024-05-31 NOTE — ED Notes (Signed)
 Pt walked with cane down ED hall and back. Pt tolerated well. Had no complaints. O2 when returned to room was 98% on RA.

## 2024-05-31 NOTE — ED Triage Notes (Signed)
 Pt brought in by friend for confusion since yesterday. Pt friend states that he had chemo on 05/30, and hasn't been eating and drinking since.

## 2024-05-31 NOTE — ED Notes (Signed)
 Stood up pt and attempted to collect UA, pt unable to void

## 2024-05-31 NOTE — ED Notes (Signed)
 2nd attempt to collect UA, pt could not void

## 2024-08-04 NOTE — Progress Notes (Signed)
 Hematology/Oncology Follow-up Visit  Patient Name:  Jerry Wall. Date of Birth:  [birth year] Date of Encounter:  08/04/2024  Referring Provider:  Edsel Lamar Cozier*, [phone]   PCP:  Laneta Tanda Agent, PA-C  Diagnoses: 1. Multiple primary lung cancers upfront. In 2021, less than 1% of cells were positive for PD-L1. ROS1, ALK, and EGFR were negative.  The pathologist staged the resected right disease as pT3N0 having 2 tumors in the same lobe.  But these are 2 primary tumors.  I stage the acinar adenocarcinoma pT1bN0M0, the other tumor on the right is pT2AN0M0 papillary adenocarcinoma.  It is 4  cm.  The 1 in the left lung has not been resected.  In February it was a cT1b N0 M0.  Repeat NGS guardant 360 negative for actionable mutations on 05/15/2022, MSS did have GNAS trial available  Treatment  1. Right upper Lobectomy 03/23/2020 2. Cisplatin/Alimta 3. SBRT    2. 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 PD-L1 is 0%.  Treatment  1. GammaKnife 2. Concurrent radiation therapy and radiation therapy 3. Imfinzi  4. Alimta  5. Taxotere - did poorly with one dose. Did not want to get another dose.       Treatment: Multiple primary lung cancers. In 2021, less than 1% of cells were positive for PD-L1. EGFR, ROS1, and ALK were negative.   1. S/P right upper lobectomy 03/23/2020 removing 2 right upper lobe primaries.  This left a left upper lobe  1.7 x 1.4 x 1.5.  This is his new baseline.  He did had adjuvant chemotherapy for the 4 cm tumor.   2. S/P 4 cycle Cisplatin/Alimta.  05/02/2020 to 07/05/2020 had significant hiccups with first dose (10 days up to 100 an hour).    Post adjuvant chemo: CT of the chest and the pelvis on 07/25/2020 showed diminished consolidative changes resection margin in the right chest.  Diminished left upper lobe pulmonary nodule.  This suggests response to treatment.  The areas of hyperdensity  and hypervascularity along the left  hemiliver.  Septal groundglass and septal thickening with suggestion of mild bronchiectatic changes in the left upper lobe.  The upper lobe nodule had shrunk from 1.7 x 1.4 to 1 x 0.7.   Bone scan negative.  On 07/25/2020.  Patient was ready for radiation therapy to left lung nodule.  Patient had response to chemotherapy.   He received radiation. Received 4 SBRT doses -48 Gy  3. RADIATION TREATMENT SUMMARY:  Site                               Start Tx           Last Tx          ED        Frac            Dose                      FxDose           Technique Rx:1 LUL Lung            08/26/2020        09/02/2020          7           4/4      4,800/4,800cGy          1,200cGy  SBRT    Patient had been fully treated 09/02/2020   CT of chest on 10/06/2020 stable postsurgical and radiation changes; there was a stable spiculated anterior left upper pleural nodule with tethering to the anterior pleura.  Bilateral groundglass opacities. Concern of the spiculated upper lobe pleural nodule remains.  That has been radiated with SBRT.  Is not much in the way of radiation change around it.  CT on 01/13/2021 the left upper lobe pulmonary nodule was either stable to minimally enlarged.  When I look at it I do not see any change.  No evidence for relapse.  CT on 11/25/2021 growing left lower lobe nodule.  PET scan was ordered.  Consideration of SBRT.  Other smaller nodules are seen.  We are watching these.    2. Stage IV adenocarcinoma with met to the brain 05/11/2022.  1. Gamma Knife -  05/29/2022 LESION TREATED:   Left parietal metastasis (20 Gy to the 50% isodose line)   Concurrent radiation therapy and chemotherapy (09/20/2022 - 10/31/2022)  2. Carboplatin, Taxol 50 mg/m2  Cycle 1 09/20/2022, Cycle 2 09/27/2022, Cycle 3 10/04/2022, Cycle 4 10/11/2022, Cycle 5 10/18/2022, Cycle 6 10/25/2022  RADIATION TREATMENT SUMMARY:  Site                                Start Tx           Last Tx          ED        Frac           Dose                    FxDose         Technique Rx:3 LMed&Hilum       09/20/2022        10/31/2022        41        30/30   6,000/6,000cGy          200cGy            VMAT  3. Imfinzi every 2 weeks - (11/28/2022 - 06/26/2023)  Cycle 1 11/28/2022, Cycle 2 12/12/2022 , Cycle 3 12/26/2022, Cycle 4 01/09/2023, Cycle 5 01/23/2023, Cycle 6 02/06/2023, Cycle 7 02/20/2023, Cycle 8 03/06/2023, Cycle 9 03/20/2023, Cycle 10 04/03/2023, Cycle 11 04/17/2023, Cycle 12 05/01/2023, Cycle 13 05/15/2023, Cycle 14 05/29/2023, Cycle 15 06/12/2023, Cycle 16 06/26/2023. DC'd on 07/10/2023 for progression in the mediastinal and hilar lymph nodes.   4. Alimta - Patient did get Alimta in 2021. We retried it. (07/17/2023 - 04/23/2024)  Cycle 1 07/17/2023, Cycle 2 08/07/2023, Cycle 3 08/28/2023, Cycle 4 09/18/2023, Cycle 5 10/11/2023, Cycle 6 11/01/2023, Cycle 7 11/22/2023, Cycle 8 12/13/2023, Cycle 9 01/09/2024, Cycle 10 01/30/2024, Cycle 11 02/20/2024, Cycle 12 03/12/2024. Held 04/02/2024 due to anemia with possible bleeding on anticoagulants.  Held on 04/10/2024 and 04/15/2024 for concern for GI bleeding. Reduced dose of Alimta to 300 mg/m2 due to renal insufficiency and chronic anemia. Cycle 13 04/23/2024. DC'd on 05/14/2024 due to progression.   5. Taxotere 75 mg/m2 - with growth factors. (05/28/2024)  Cycle 1 05/28/2024. Patient had a confused state with 3 days of amnesia and weakness. He also had fatigue, nausea, vomiting, lack of appetite and diarrhea. We could reduce his dose to 60 mg/m2. After discussion with patient on 06/05/2024, he does not want to take Taxotere again, as it  caused so many problems. DC'd after 1 cycle.   We are giving patient a long break from chemotherapy to allow him to recover from the side effects of Taxotere.   -----------------------------------------------------------------  Initial Hematologic/Oncologic History: Jerry Wall. is a 69 y.o. male, who is a long-term smoker, who was seen in consultation at the request  of surgery for an evaluation of lung cancer.  Primary physician obtained a screening CT scan and found multiple masses on 01/22/2020.   PET scan 02/03/2020: No hypermetabolic mediastinal, hilar or axillary lymph nodes.  Spiculated nodule in the apical segment right upper lobe measures 10 mm (7 x 12  mm, 3/81) with an SUV max 3.4. Spiculated mass in the  medial anterior segment right upper lobe measures 1.5 x 3.6  cm (3/93) with an SUV max 3.6. Spiculated and slightly cavitary nodule  in the peripheral anterior left upper lobe measures 11 x 12 mm (3/95) with an SUV  max of 5.2. There are additional pure ground-glass nodules bilaterally which do not show metabolism above blood pool. Lesions are unchanged from 01/12/2020.  Right upper lobectomy was performed by Dr. Edsel on 03/23/2020 on the  2 right lung masses.  Right upper lobe mass was 4 cm x 1.8 cm.  This was a papillary tumor that was TTF-1 positive.  The smaller right-sided lung cancer upper lobe was 1.5 cm  and was an acinar tumor.  Both adenocarcinoma.  Both tumors were well differentiated.  Bronchial margins were negative.  0 out of 12 nodes were positive.   The pathologist staged it is pT3N0 having 2 tumors in the same lobe.  But these are 2 primary tumors.  I stage the acinar adenocarcinoma pT1bN0M0, the other tumor on the right is  pT2AN0M0.  It is 4 cm.  The 1 in the left lung has not been resected.  In February it was a T1b N0 M0   Feels fairly good after surgery. Weight loss weight loss 7 pounds after surgery.  No bone pain. MRI of brain reveals no cancer. Small lacunar infarts.   Patient had been feeling fatigued since he completed radiation therapy at the end of 2021. Patient stated Dr. Blake was unable to treat with radiation therapy due to the location.      Interim Note:  Jerry Wall. returns today for follow up of lung cancer.   He is feeling better today. Reviewed recent scan with patient.     Review of  Systems: All other systems are negative.  Current Outpatient Medications:  .  albuterol  HFA (PROVENTIL  HFA;VENTOLIN  HFA;PROAIR  HFA) 90 mcg/actuation inhaler, Inhale 2 puffs every 6 (six) hours as needed for wheezing or shortness of breath., Disp: 1 each, Rfl: 11 .  amLODIPine (NORVASC) 5 mg tablet, Take 0.5 tablets (2.5 mg total) by mouth daily., Disp: 45 tablet, Rfl: 1 .  apixaban (ELIQUIS) 5 mg tab, Take 1 tablet (5 mg total) by mouth 2 (two) times a day., Disp: 60 tablet, Rfl: 5 .  cholecalciferol (VITAMIN D3) 1,000 unit (25 mcg) tablet, Take 1,000 Units by mouth daily., Disp: , Rfl:  .  cyanocobalamin (VITAMIN B12) 1,000 mcg tablet, Take 1,000 mcg by mouth Once Daily., Disp: , Rfl:  .  dexAMETHasone  (DECADRON ) 4 mg tablet, Take 2 tablets (8 mg total) by mouth in the morning and 2 tablets (8 mg total) in the evening. Take with meals. Take prescribed dose in the morning and at 2 pm with food on the day  BEFORE and the day AFTER Taxane infusion.., Disp: 72 tablet, Rfl: 1 .  escitalopram (LEXAPRO) 10 mg tablet, Take 1.5 tablets (15 mg total) by mouth daily., Disp: 135 tablet, Rfl: 1 .  ferrous sulfate 325 mg (65 mg iron) tablet, Take 1 tablet (325 mg total) by mouth daily before breakfast., Disp: 90 tablet, Rfl: 5 .  folic acid  (FOLVITE ) 1 mg tablet, TAKE 1 TABLET(1 MG) BY MOUTH DAILY, Disp: 30 tablet, Rfl: 3 .  losartan (COZAAR) 100 mg tablet, TAKE 1 TABLET(100 MG) BY MOUTH DAILY, Disp: 90 tablet, Rfl: 0 .  pantoprazole  (PROTONIX ) 40 mg EC tablet, TAKE 1 TABLET BY MOUTH TWICE DAILY FOR 7 DAYS, THEN 1 TABLET DAILY, Disp: 90 tablet, Rfl: 2 .  rosuvastatin (CRESTOR) 5 mg tablet, TAKE 1 TABLET(5 MG) BY MOUTH EVERY NIGHT, Disp: 90 tablet, Rfl: 0 .  traMADoL (ULTRAM) 50 mg tablet, Take 1 tablet (50 mg total) by mouth every 6 (six) hours as needed for moderate pain (4-6)., Disp: 100 tablet, Rfl: 0 .  olopatadine (Pataday Twice Daily Relief) 0.1 % ophthalmic solution, Administer 1 drop into each eyes 2  (two) times a day. (Patient not taking: Reported on 08/04/2024), Disp: 5 mL, Rfl: 2 Past medical history, allergies, current medications, social history and family history were reviewed and updated when appropriate.     Patient Active Problem List  Diagnosis  . Mixed hyperlipidemia  . Chemotherapy management, encounter for  . Erectile dysfunction  . Leukocytosis  . Multiple lung nodules on CT  . S/P lobectomy of lung  . Malignant neoplasm of upper lobe of right lung (HCC)  . Chemotherapy follow-up examination  . SOB (shortness of breath)  . Dehydration  . Malignant neoplasm of upper lobe of left lung (HCC)  . Metastasis to brain (HCC)  . Stage IV lung cancer with metastasis to brain  . Encounter for antineoplastic immunotherapy  . Major depressive disorder with single episode, in partial remission  . Diverticular disease of colon  . Feces contents abnormal  . Gastroesophageal reflux disease  . History of colonic polyps  . Hernia of anterior abdominal wall  . Folate deficiency  . Essential hypertension  . Pulmonary embolism    (CMD)  . Hypokalemia  . GI bleeding  . B12 deficiency  . Cutaneous abscess of head excluding face  . Periodontal disease  . Itchy eyes  . Vitamin D deficiency    No Known Allergies   Physical Examination: ECOG 3 Vital Signs: There were no vitals taken for this visit. General:  Healthy-appearing male in no acute distress. HEENT: Normocephalic, atraumatic. PERRLA, EOMI, sclera anicteric.  Nose without discharge, nares patent. No thrush or mucositis noted on oropharyngeal exam.  Pharynx had no tonsillar enlargement or exudates.  Neck supple without masses.  Lymphatic/Immunologic:  No cervical, axillary, or femoral adenopathy.  Cardiovascular:  RRR without significant murmurs, gallops, or rubs.  No JVD on exam.   Respiratory:  Clear to auscultation bilaterally with no wheezes, rales, or rhonchi; no respiratory distress or accessory muscle use noted on  exam.  Gastrointestinal:  Abdomen soft, nontender and nondistended. No ascites noted clinically on examination. Liver edge smooth and nontender. Spleen not palpable. No rebound or guarding tenderness. No masses palpable on exam. Musculoskeletal:  No bony pain or tenderness. No tenosynovitis or joint effusions noted. Extremities: No cyanosis, clubbing. Feet and hands are warm to touch. Good capillary refill.  Neurologic: Cranial nerves III through XII are intact by examination. Muscle strength 5 out of  5 in the upper and lower extremities to flexion and extension. Sensation grossly intact. He is alert and oriented 3. Psychiatric:  Mood and affect are appropriate. Speech fluent.    Results: Results for orders placed or performed in visit on 08/04/24  Comprehensive Metabolic Panel   Collection Time: 08/04/24  1:29 PM  Result Value Ref Range   Sodium 139 136 - 145 mmol/L   Potassium 3.7 3.4 - 4.5 mmol/L   Chloride 110 (H) 98 - 107 mmol/L   CO2 22 21 - 31 mmol/L   Anion Gap 7 6 - 14 mmol/L   Glucose, Random 128 (H) 70 - 99 mg/dL   Blood Urea Nitrogen (BUN) 15 7 - 25 mg/dL   Creatinine 8.07 (H) 9.29 - 1.30 mg/dL   eGFR 38 (L) >40 fO/fpw/8.26f7   Albumin  3.3 (L) 3.5 - 5.7 g/dL   Total Protein 6.4 6.4 - 8.9 g/dL   Bilirubin, Total 0.2 (L) 0.3 - 1.0 mg/dL   Alkaline Phosphatase (ALP) 92 34 - 104 U/L   Aspartate Aminotransferase (AST) 13 13 - 39 U/L   Alanine Aminotransferase (ALT) 11 7 - 52 U/L   Calcium 8.5 (L) 8.6 - 10.3 mg/dL   BUN/Creatinine Ratio 7.8 (L) 10.0 - 20.0   Corrected Calcium 9.1 mg/dL  CBC with Differential   Collection Time: 08/04/24  1:29 PM  Result Value Ref Range   WBC 9.74 4.40 - 11.00 10*3/uL   RBC 3.19 (L) 4.50 - 5.90 10*6/uL   Hemoglobin 10.0 (L) 14.0 - 17.5 g/dL   Hematocrit 69.5 (L) 58.4 - 50.4 %   Mean Corpuscular Volume (MCV) 95.5 80.0 - 96.0 fL   Mean Corpuscular Hemoglobin (MCH) 31.2 27.5 - 33.2 pg   Mean Corpuscular Hemoglobin Conc (MCHC) 32.7 (L) 33.0 -  37.0 g/dL   Red Cell Distribution Width (RDW) 17.0 12.3 - 17.0 %   Platelet Count (PLT) 409 150 - 450 10*3/uL   Mean Platelet Volume (MPV) 6.4 (L) 6.8 - 10.2 fL   Neutrophils % 70 %   Lymphocytes % 15 %   Monocytes % 12 %   Eosinophils % 2 %   Basophils % 1 %   Neutrophils Absolute 6.80 1.80 - 7.80 10*3/uL   Lymphocytes # 1.50 1.00 - 4.80 10*3/uL   Monocytes # 1.20 (H) 0.00 - 0.80 10*3/uL   Eosinophils # 0.20 0.00 - 0.50 10*3/uL   Basophils # 0.10 0.00 - 0.20 10*3/uL    PET/CT SKULL BASE TO MID THIGH: 07/28/2024  Narrative & Impression  CLINICAL DATA:  Subsequent treatment strategy for lung carcinoma.SABRA   EXAM: NUCLEAR MEDICINE PET SKULL BASE TO THIGH   TECHNIQUE: 11.8 mCi F-18 FDG was injected intravenously. Full-ring PET imaging was performed from the skull base to thigh after the radiotracer. CT data was obtained and used for attenuation correction and anatomic localization.   Fasting blood glucose: 92 mg/dl   COMPARISON:  PET scan 05/12/2019   FINDINGS: Mediastinal blood pool activity: SUV max   Liver activity: SUV max NA   NECK: No hypermetabolic lymph nodes in the neck.   Incidental CT findings: None.   CHEST: Again demonstrated hypermetabolic mediastinal lymph nodes. Dominant RIGHT lower paratracheal node measures 2.5 cm compared to 2. 2 cm remeasured. Node remains intensely hypermetabolic with SUV max equal 11.0 compared SUV max equal 8.4. Subcarinal node measuring 13 mm compares to 12 mm SUV max 8.9 compared SUV max 8.7. Lymph node in the anterior mediastinum anterior this use CT with SUV  max equal 4.0 compared SUV max equal 3.3 (image 49. Small node measures 10 mm compared to 7 mm   No hypermetabolic pulmonary nodules. Mild metabolic activity associated with interstitial thickening in the LEFT lower lobe similar prior.   Incidental CT findings: None.   ABDOMEN/PELVIS: No abnormal hypermetabolic activity within the liver, pancreas, adrenal glands, or  spleen. No hypermetabolic lymph nodes in the abdomen or pelvis.   Incidental CT findings: None.   SKELETON: No focal hypermetabolic activity to suggest skeletal metastasis. Post traumatic uptake in LEFT lateral ribs.   Incidental CT findings: None.   IMPRESSION: 1. Hypermetabolic mediastinal lymph nodes are similar to prior exam. Dominant RIGHT lower paratracheal node is slightly larger. 2. No evidence of distant metastatic disease. 3. Mild metabolic activity associated with interstitial thickening in the LEFT lower lobe is similar prior exam. Favor post therapy inflammation.     Electronically Signed   By: Jackquline Boxer M.D.   On: 07/28/2024 14:58    Assessment and Plan: 08/04/2024; Last seen 07/07/2024    LUNG CANCER:  Multiple primary lung cancers. In 2021, EGFR, ALK, and ROS1 were negative. Less than 1% of cells were  positive for PD-L1. Status post right upper lobectomy (03/23/2020) removing 2 right upper lobe different primaries. This leaves a left upper lobe 11 x 12 mm lesion which looks like another primary. It had not been biopsied. This was a field effect and  will likely develop more primaries over time.  The pathologist staged the resected right disease as pT3N0 having 2 tumors in the same lobe, but these were 2 primary tumors.  I staged the acinar adenocarcinoma pT1bN0M0, the other tumor on the right was  pT2AN0M0 papillary adenocarcinoma.  It was 4 cm. The 1 in the left lung had not been resected.  In February, it was a cT1b N0 M0.     I recommended adjuvant chemotherapy with cisplatin Alimta every 3 weeks x 4 and then SBRT to the left lung lesion. Which was completed. S/P 4 cycle Cisplatin/Alimta. Treated from 05/02/2020 to 07/05/2020 had significant hiccups with first dose (10 days up to 100 an hour).   He received radiation RADIATION SUMMARY: Site      Start Tx           Last Tx            ED       Frac     Dose    FxDose            Technique Rx:1 LUL Lung             08/26/2020        08/26/2020                     1/4  (first dose)    1,200/4,800cGy          1,200cGy             SBRT  Received 4 SBRT total 48 cgy  10/27/2021 persistent enlargement of an aggressive appearing nodule in the base of the left lower lobe, highly concerning for a metastatic lesion. This lesion currently measures 1.0 x 0.9 x 1.1 cm. Further evaluation with PET-CT is recommended. 2. Multiple other tiny 2-4 mm pulmonary nodules scattered throughout the lungs bilaterally, nonspecific, but concerning for potential additional metastatic lesions  Further history below  1. Multiple primary lung cancers.  Originally, in 2021, less than 1% of cells were positive for PD-L1; EGFR,  ROS1, and ALK were negative. 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 PD-L1 is 0%.   Patient was fully treated 08/26/2020.  No evidence recurrence by history or physical.  CT scan showed minimal questionable growth in the left upper lobe nodule.  I still do not see the growth. Patient saw Dr. Blake in July with a CT scan.   CT scan 07/21/2021 revealed an enlarging left lower lobe nodule most indicative of a possible tumor.  Nodular scarring and subpleural anterior left upper lobe is unchanged.  This is where he was he was radiated. Lesion in lower upper lobe 6 mm.   Repeat scanning on 10/27/2021 showed increasing size of the left lower lobe nodule.  Radiation has seen this and set up a PET scan.  If no further disease is detected, then he will undergo SBRT. There are some other nodules present. They did not  appear to be growing. We will have to keep an eye on these.  Further nodules continue to grow then we will have to consider systemic therapy.  Patient is asymptomatic at this time.  He is working.  PET scan on 11/13/2021 ordered by radiation therapy showed left lower nodule of concern is not significantly FDG avid, and is just above PET resolution. Given enlargement over prior chest CTs, this remains suspicious  for metastasis or metachronous primary  bronchogenic carcinoma.   Based on the CT and PET scan in October and November of 2022, the left lower nodule appears to be slow growing. There is no evidence of metastatic disease and there is very little activity. I discussed with radiation oncology if they are planning  to monitoring the nodule with scans or if they are considering radiating the nodule.   CT of the chest on 02/02/2022 showed two solid right pleural nodules, new/increased since 11/13/2021 PET-CT as detailed, suspicious for right pleural metastases. Indeterminate waxing and waning scattered small bilateral pulmonary nodules. Dominant solid  left lower lobe pulmonary nodule is decreased. A few new indistinct bilateral pulmonary nodules, largest 0.7 cm in the posterior right lower lobe.    Underwent biopsy of right lung nodule.  Tissue exam on 02/26/2022 indicated well-differentiated adenocarcinoma, consistent with lung primary. Cannot tell if this is metastatic from his initial primary or if this is a new primary , so we sent a new gene panel and redid the PD-L1 on the tissue from 02/26/2022. PD-L1 is 0%.   CT of the chest, abdomen, pelvis on 05/04/2022 showed spontaneous regression of prevertebral pleural nodule that is known to be adenocarcinoma by biopsy on 02/26/2022. Nodule measures 9 x 7 mm (previously 1.6 x 1.2 cm). Other previously noted lateral right pleural nodule is stable. Likewise, previously noted small pulmonary nodule in the base of the left lower lobe is unchanged.      MRI of the brain on 05/08/2022 found a peripherally enhancing 6 mm lesion in the left parietal lobe, compatible with metastasis. Bone scan on 05/08/2022 found no evidence of osseous metastasis.   We discussed starting chemo/immunotherapy now vs scanning him every 3 months and starting treatment with progression. He had minimal disease. Had 2 tiny pleural base nodules that  have regressed on their own.  We discussed the  benefits and side effects of treatment again. Patient wanted to wait to start treatment since he felt well. We held chemo/immunotherapy.  S/p Gamma Knife to left parietal lobe on 05/29/2022.   CT of the chest, abdomen, pelvis on 08/03/2022 showed mild interval increase  in size of upper normal mediastinal and mildly enlarged left hilar lymph nodes. Close follow-up recommended by radiologist. Stable bilateral pulmonary nodules.  PET scan on 08/31/2022 showed mildly enlarged lymph nodes that are progressive. Mediastinum and left hilar region show low level FDG uptake. No hypermetabolic pulmonary nodule or mass  on the current study. Small paraspinal nodule in the right hemithorax is decreased substantially in size since the 05/04/2022 exam. No new or progressive pulmonary nodule had the biopsy-proven oral base mass  Continued to shrink for unclear reasons.  Spontaneous remission.  Mediastinal disease is mildly progressive.   We discussed treating with chemoradiation, followed by immunotherapy. If we could do radiation therapy, we discussed treating with Carbo/Alimta/Keytruda every 3 weeks x 4, followed by maintenance Keytruda/Alimta.  Patient preferred chemoradiation followed  by immunotherapy.    We treated with concurrent chemotherapy and radiation therapy, followed by immune therapy.  Started Carbo/Taxol and radiation therapy on 09/20/2022. S/p Cycle 6 of Carbo/Taxol on 10/26/2022. Completed radiation therapy on 10/31/2022.   CT of the chest, abdomen, pelvis on 11/14/2022 showed stable to slightly improved mediastinal and left hilar adenopathy. No evidence of disease progression.  Stable left lower lobe pulmonary nodule.  Patient completed chemoradiation. Had an early, initial response to treatment. We will now treat with Imfinzi, every 2 weeks for 1 year. We discussed the benefits and side effects of Imfinzi.  Started Imfinzi 10 mg/kg on 11/28/2022.   CT of the chest on 02/15/2023 showed interval  development of right paratracheal and right paraesophageal lymphadenopathy, concerning for metastatic disease. New 6 mm posterior right lung nodule. New 17 mm sub solid nodule in the left apex, nonspecific. Stable, 9 mm nodule at the left lung base.   No definitive progression by CT scan on 02/15/2023 by my reading. These pulmonary nodules may be inflammatory and not malignant. Looks like he is stable. We will continue Imfinzi and monitor him with scans.   S/p Cycle 16 of Imfinzi on 06/26/2023. DC'd Imfinzi on 07/10/2023 for progression. Last TSH was 0.548 on 07/17/2023.    PET scan on 07/05/2023 showed multifocal hypermetabolic mediastinal and left hilar adenopathy, progressive from previous PET-CT and chest CT, consistent with metastatic disease. Mildly increased metabolic activity within the subpleural opacity at the right lung apex, likely treatment related. No highly suspicious pulmonary findings. The ground-glass opacity at the left lung apex appears less conspicuous and does not demonstrate any hypermetabolic activity.  He is progressing in the mediastinal and left hilar lymph nodes by CT scan on 07/10/2023. I recommended switching to Alimta alone. Alimta has been given in 2021 but has not been given since, so he may still be sensitive. Patient had side effects to Cisplatin, so I am hesitant to give him Carboplatin. He is agreeable to this.   Discussed benefits and side effects of Alimta. He needs to take Decadron , 1 tablet BID the day before and the day after treatment, and 1 tablet the evening of treatment, to prevent a rash. I prescribed folate for him to take daily. We gave a B12 injection on 07/10/2023. Re-started Alimta on 07/17/2023.   PET scan on 10/07/2023 showed radiation changes in the left upper lobe. Thoracic nodal metastases have mildly improved. No suspicious findings for metastasis in the abdomen or pelvis.   PET scan on 01/08/2024 showed persistent hypermetabolic mediastinal adenopathy,  similar to the most recent study. No new or progressive findings. No hypermetabolic pulmonary activity. Stable treatment changes in both lungs. No evidence of metastatic disease in  the abdomen or pelvis.  Held Alimta on 04/02/2024 for rapid fall in hemoglobin with possible bleeding; he was newly on anticoagulants for a PE. We transferred him to the ED for an evaluation. Held again on 04/10/2024 and 04/15/2024 due to concern for GI bleeding after restarting anticoagulants. Advised to start Protonix  on 04/10/2024 and hold Eliquis for 1 week. Advised to hold for an additional week on 04/15/2024, due to possible bleeding.   Reduced dose of Alimta to 300 mg/m2 on 04/23/2024 due to renal insufficiency and chronic anemia. Appears to have stopped bleeding because his hemoglobin is increasing.  Patient did not have much energy after Alimta. He started feeling better after taking his steroid. He occasionally had hiccups after chemotherapy. He was taking a muscle relaxant for the hiccups. Patient stated he was able to tolerate the side effects and did not want to reduce his dose.    S/p Cycle 13 of Alimta (second time use) on 04/23/2024. DC'd on 05/14/2024 due to progression. PET scan on 05/11/2024 showed mildly worsened hypermetabolic mediastinal adenopathy. A cutaneous lesion along the right posterior parietal scalp at the vertical level of the lateral ventricles has hazy accentuated cutaneous and subcutaneous density with maximum SUV 9.9. This could be inflammatory but scalp melanoma or similar cutaneous malignancy is not totally excluded, and visual inspection of the right parietal scalp and correlation with any underlying cutaneous lesion or obvious source of inflammation is suggested. Mild progression likely.   Discussed Taxotere with the patient. I explained that we give growth factors with each cycle. Recommended taking Claritin to reduce bone pain from growth factors. I prescribed ultram on 05/28/2024 if needed for  bone pain with treatment.   Started Taxotere 75 mg/m2 on 05/28/2024. Patient presented to [facility] ED on 05/31/2024 with confusion. He was given 1 L of normal saline and encouraged to increase hydration at home. He had a confusion state with 3 days of amnesia, and weakness after Taxotere. According to [employer], confusion is a rare side effect of Taxotere. He presented to our urgent care on 06/03/2024 with complaints of decreased appetite, nausea, vomiting, and 1 episode of diarrhea. Received IV fluids on 06/03/2024, 06/05/2024, and 06/08/2024 due to dehydration. We considered continuing Taxotere at a reduced dose of 60 mg/m2 on 06/01/2024. However, after the side effects, including fatigue, confusion, nausea, vomiting, and lack of appetite, he would prefer not to continue Taxotere. DC'd after 1 cycle of Taxotere.   We could switch to Navelbine. Had leukocytosis with WBC of 50,300 on 06/08/2024. Delayed leukocytosis from recovery with growth factor. Patient had no obvious infection by history or physical exam.   Discussed Gemcitabine and Navelbine with the patient. I would like to see him improve more before we consider starting him on a new treatment.   Patient is feeling better so we are continued to give him a long break from chemotherapy. Will monitor for now. We will consider treatment if we start to see some growth.   PET scan on 07/28/2024 showed hypermetabolic mediastinal lymph nodes that were similar to prior exam. Dominant right lower paratracheal node was slightly larger. No evidence of distant metastatic disease.    I recommend Navelbine. Patient would like to hold off on getting treatment for now. Patient had a hard time recovering from 1 cycle of taxotere, so we will give him 2 months break from chemotherapy.  Will get repeat PET scan in 8 weeks  RTC 8 weeks, 2 days after the PET scan.  2. Mild anemia with mild macrocytosis.  B12 and folate were normal 10/27/2020. On 05/08/2022, his Hgb was 13.8  with an MCV of 99.4.     Patient reported having black stools around the week of 03/16/2024, prior to starting anticoagulants. He then started Eliquis. Admitted to the hospital on 04/02/2024 for transfusion and evaluation for bleeding. EGD on 04/02/2024 found mild erethyma and erosions in the gastric antrum. No old or fresh blood. Tissue exam was negative for H.pylori associated gastritis. Colonoscopy was performed on 04/03/2024 which found medium hemorrhoids, and seven sessile polyps in the descending and sigmoid colon. 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   On 04/02/2024, his ferritin was 217 with a percent saturation of 16%, B12 was 1,437, folate was 20.0. No definite M spike by SPEP on 04/10/2024.   On 04/10/2024, patient was back on anticoagulants and he said he passed another black stool on 04/08/2024. He was found to have gastric ulcers by EGD on 04/02/2024 and may have GI  bleeding from the ulcers. Holding Eliquis for 1 week. I prescribed Protonix  1 tablet BID for 1 week, then 1 tablet daily on 04/10/2024.   On 04/15/2024, he reported passing dark stools again from 04/12/2024 - 04/14/2024. He had been holding Eliquis as directed and was on Protonix . I advised him to continue to hold Eliquis for 1 week. If he continued to pass dark stools, I advised him to go the ED.    Patient reported he was still passing dark stools on 04/23/2024.  Appeared to have stopped bleeding because his hemoglobin was increasing. Advised to hold Eliquis for 1 more week on 04/23/2024. Stool guaiacs on 04/30/2024 were negative. Restarted Eliquis on 04/30/2024. His hemoglobin fell on 05/04/2024.   He denied passing dark stools on 06/08/2024 and 06/25/2024.   Last ferritin was 56 with a percent saturation of 9%.   Today, his Hgb is 10.0 g/dL.   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 H/O pneumonia by CT scan on 05/04/2022 - Resolved. Patient reported coughing more often. I prescribed Levaquin 500 mg  daily for 10 days, starting 05/11/2022.    Patient was discovered to have pneumonia the weekend of July 19th. He was placed on antibiotics and did not clinically appear to have pneumonia. He received chemotherapy on 07/17/2023.  5. Brain met - MRI on 05/08/2022 found a peripherally enhancing 6 mm lesion in the left parietal lobe. S/p Gamma Knife on 05/29/2022.   MRI of the brain on 07/30/2022 showed sequela of posttreatment changes with near complete resolution of previously seen left parietal lobe metastatic lesion. Faint residual enhancement  in the treatment bed. No new intracranial lesions identified.   MRI of brain on 12/27/2022 showed similar appearance of the punctate metastasis in the right superior frontal gyrus. No new lesions relative to most recent exam dated 11/02/2022. Treated left parietal metastatic lesion has essentially resolved.   MRI on 03/28/2023 found no definite residual enhancement associated with the previously identified lesions in the right frontal and left parietal lobes. No new enhancing lesions identified.   MRI of the brain on 07/20/2023 showed similar focus of enhancement in the area of the previously treated lesion in the left parietal lobe. No evidence of progression.   MRI of the brain on 10/03/2023 found no evidence of disease progression. No new metastasis identified.   MRI of the brain on 01/13/2024 found no new or progressive intracranial metastatic disease. No acute intracranial abnormality.    MRI of the brain  on 04/14/2024 showed no new or progressive intracranial metastatic disease. No acute intracranial abnormality.  6. CRI; Elevated creatinine - his creatinine was 2.15. I advised him to drink a lot of fluids. Creatine was normal at 1.37 on 12/12/2022.  His creatinine was 1.74 on 11/22/2023. Creatinine was 2.22 on 07/17/2024.  7. H/o worsening esophagitis/reflux with chemoradiation - we placed him on Protonix  for a month.  8. H/o changes in urine - on 12/12/2022, urrinalysis was positive for  leukocyte estrace with elevated white cells. Early infection. Prescribed Macrobid 1 twice daily for 7 days.    9. H/o chest pain - Unlikely to be heart related based on EKG performed on 06/25/2023. First instance occurred 06/22/2023. We might have to get a stress echo if it worsens.  10. Leukocytosis and thrombocytosis - from malignancy and recovery from chemotherapy. Does not appear to have an infection at this time. Today, his WBC is 9,740.   11. H/o pimple with mild infection in his skin right beside his left eye - prescribed Keflex 500 mg TID for 5 days. He was going to use warm compresses at night to open and drain it.     12. Shortness of breath - on 03/19/2024, ordered chest X-ray and VQ scan to rule out blood clots. Lung ventilation study on 03/20/2024 showed unmatched perfusion defect in the right upper lobe, most concerning for acute pulmonary embolism. I prescribed Eliquis on 03/20/2024.    Patient has shortness of breath when he walks. Prescribed an albuterol  inhaler, 2 puffs every 4-6 hours, on 06/25/2024.   13. H/o mouth soreness - on 03/19/2024, he had fungal infection. I prescribed Diflucan.   14. Pulmonary embolism - On 03/19/2024, he complained of shortness of breath, upper back, and stated he felt terrible. Oxygen saturation was 98%. Lung ventilation study on 03/20/2024 showed unmatched perfusion defect in the right upper lobe, most concerning for acute pulmonary embolism.   I prescribed Eliquis on 03/20/2024. Eliquis was held from 04/02/2024 - 04/05/2024 due to concerns for GI bleeding and hospital admission for GI evaluation.   After restarting Eliquis, he passed black stools on 04/08/2024. He was found to have gastric ulcers by EGD on 04/02/2024 and may have GI  bleeding from the ulcers. Held Eliquis for 1 week. I prescribed Protonix  1 tablet BID for 1 week, then 1 tablet daily on 04/10/2024.   On 04/15/2024, he reported passing dark stools again from 04/12/2024 - 04/14/2024. He had been holding  Eliquis and was on Protonix . I advised him to continue to hold Eliquis for 1 week. If he passes black and sticky stool, I advised him to go the ED.    Continued to hold for 1 week on 04/23/2024 due to patient still passing dark stools.    Patient reported passing black and sticky stool on 04/23/2024. He passed dark diarrhea on 04/30/2024. His hemoglobin has been rising so he has stopped bleeding. Restarted Eliquis on 04/30/2024. He has not noticed any blood in his stool, however, his stools are still dark black in color.   15. Thrombocytopenia - his platelet count was 149,000 on 05/04/2024. Today, his platelet count is 409,000.     Zachary Slocumb, MD   In total, 45 minutes of time was spent on this encounter, greater than 50% of which was spent face-to-face with Rolan Hughie Campbell Mickey. for the  history, physical exam, assessment and formulation of treatment plan.  This document serves as a record of services personally performed by Zachary Slocumb, MD.  It was created on their behalf by Eveleen Elsie Hue, Scribe, a trained medical scribe. The creation of this record is the provider's dictation and/or activities during the visit.   Electronically signed by: Eveleen Elsie Hue, Scribe 08/04/2024 2:10 PM

## 2024-08-23 NOTE — Progress Notes (Signed)
 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  Post-operative Diagnosis:  Same.  Procedure:  Two instances of Gamma Knife stereotactic radiosurgery:   LESION TREATED:   Left parietal, 20 Gy to the 85% isodose line Left occipital, 20 Gy to the 85% isodose line   COLLIMATOR SIZE:  8 mm   TOTAL NUMBER OF ISOCENTERS:   2  Surgeon:  Yancy MICAEL Fare, MA, MD, [facility], [facility]  Anesthesia:  Local.  Operative Indications: We discussed the risks and potential benefits of radiosurgery, fractionated radiation, systemic or local chemotherapy, experimental treatments, open surgery, combinations of these treatments, and observation. Possible late effects of radiation including radiation necrosis requiring steroids or surgery and leading to temporary or permanent loss of any or all neurologic functions were explained.  Local and of out-of-field failure was discussed. Increased risk of out-of-field failure because of his choice of Gamma Knife alone at this time and the concept of salvage (which may or may not be successful) were covered in detail. The potential disadvantages of not having a tissue diagnosis of each lesion were reviewed.  Risks of glucocorticoids including aseptic necrosis of the major joints, osteoporotic fractures, diabetes with all of its risks, axial weight gain, DVT/PE, and bowel perforation which could be fatal were covered. We discussed the roles of all of the members of the radiosurgical team and the potential benefits and risks of overlapping procedures; informed consent for overlapping procedures was obtained.  Jerry Wall expresses good understanding of the relative risks and potential benefits of the available alternatives and requests radiosurgery with the [facility].  Description of the Procedure:  Jerry Wall was admitted to the [facility] where a four-pin Le[place]ell stereotactic frame was placed using local  anesthetic consisting of 2% lidocaine  mixed 1:1 with long acting local anesthetic. Prophylactic antibiotics are not indicated for [facility] radiosurgery because of the absence of a surgical incision. DVT prophylaxis is not indicated during Gamma Knife radiosurgery for Jerry Wall because no anesthesia is employed and frequent extremity movement/ambulation occurs throughout the procedure. Magnetic resonance imaging was obtained including high-resolution, post intravenous gadolinium, volumetric images through the lesions.  The images were transferred to the [facility] workstation.  Separate plans were designed that nicely encompassed each of the metastases. Dr. Michael D. Chan then prescribed doses which conformed to the visualized tumor-margins.  Jerry Wall was then admitted to the [facility] suite where pre-procedure verification (time out) was performed per institutional protocol. The radiosurgical doses were administered to each of the metastases without incident.   At the conclusion of treatment the stereotactic frame was removed and the pin sites were treated with antibiotic ointment and dressed with Band-Aids. No incision was made nor were instruments or sponges inserted; thus all sponges and instruments were accounted for.  Post-operative Plans and Instructions: Jerry Wall was then discharged with plans for follow up with a brain MRI scan in approximately twelve weeks. We discussed that any new lesions may be candidates for [facility] radiosurgery should such lesions arise in the future.  We also discussed the caveat that most experts would recommend even more strongly considering whole brain radiation in cases where four or more brain metastases have occurred. Signs of increased intracranial pressure were reviewed. Focal neurologic problems were also described. He understands that worsening of her symptoms as the tumor dies may occur and that if it does steroids (Decadron ) will be a primary treatment  modality and  that surgery might be required. He knows to call or seek medical attention before then should any problems or questions arise that are potentially related to the tumor or to the [facility] treatment and agrees to do so.    My contact information is: Department of Neurosurgery [facility] [place], [place] [address] Direct Phone: [phone]

## 2024-08-24 NOTE — Progress Notes (Signed)
 [facility] [place]  Return Patient Visit Jerry Wall. DOB: [birth year]  MRN: [id number] Visit Date: 08/24/2024  Encounter Provider: Laneta Tanda Agent, PA-C  Subjective Jerry Wall. is a 69 y.o. male who presents for Depression (Patient can tell a difference since increasing Lexapro) History of Present Illness The patient is a 69 year old male who presents for evaluation of depression, hypertension, and hematuria.  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  Dose was increased on 07/17/2024, as he reported that depression symptoms were worsening.  Noted difficulty concentrating and completing tasks.  Since increasing dose, he reports an improvement in his motivation and concentration. He has not experienced any side effects such as headaches or nausea, and his sleep pattern remains unchanged.   Hypertension SYMPTOMS:nPertinent negatives - chest pain, chest pressure/discomfort, fatigue, irregular heart beat,  and palpitations. Endorses some dyspnea on exertion.  He endorses leg swelling for the past few months, which he does not elevate except when lying down. He does not own below-the-knee compression socks. HABITS: --Patient has been following a reduced sodium diet. -- He is not getting adequate exercise. --He is drinking 3 to 4 cups of coffee per day and has quit drinking alcohol . HOME BLOOD PRESSURE: does check BP at home periodically. It has been inconsistent, mostly being in 140s-150s systolic. BP is 123/80 in office today. MEDICATIONS: He is taking antihypertensive medications correctly.  He  is prescribed losartan 100 mg daily and amlodipine 5 mg daily. RISK FACTORS: Smoking: Yes, 1 pack/day; not ready to quit yet Exercise: No  Hematuria -He experienced urinary urgency and difficulty urinating last week, which he believes was due to a blood clot in his urinary tract. He reports no current burning sensation during urination, no recent blood in his urine, and no urinary urgency or hesitancy. He also reports no fevers, chills, or pain in the sides, lower back, or lower abdomen. He passed a dark-colored clot on Thursday morning after three days of symptoms.  Objective Blood pressure 123/80, pulse 100, temperature 97.9 F (36.6 C), resp. rate 16, height 1.676 m (5' 6), weight 77.6 kg (171 lb), SpO2 98%. Physical Exam Respiratory: Clear to auscultation, no wheezing, rales or rhonchi Cardiovascular: Regular rate and rhythm, no murmurs, rubs, or gallops Gastrointestinal: Mild tenderness in the left upper abdomen, otherwise soft, no distention, no masses  Physical Exam Constitutional:      General: He is not in acute distress.    Appearance: Normal appearance. He is not ill-appearing or toxic-appearing.  HENT:     Head: Normocephalic and atraumatic.     Right Ear: External ear normal.     Left Ear: External ear normal.     Nose: Nose normal.  Eyes:     General:        Right eye: No discharge.        Left eye: No discharge.  Cardiovascular:     Rate and Rhythm: Normal rate and regular rhythm.     Heart sounds: Normal heart sounds. No murmur heard.    No friction rub. No gallop.  Pulmonary:     Effort: Pulmonary effort is normal. No respiratory distress.     Breath sounds: Normal breath  sounds. No stridor. No wheezing, rhonchi or rales.  Abdominal:     General: Abdomen is flat. Bowel sounds are normal. There is no distension.     Palpations: Abdomen is soft. There is no mass.     Tenderness: There is no abdominal tenderness. There is no right CVA tenderness, left CVA  tenderness, guarding or rebound.     Hernia: No hernia is present.     Comments: Mild discomfort to LUQ on palpation; chronic and unchanged from baseline  Musculoskeletal:     Cervical back: Normal range of motion.  Skin:    General: Skin is warm and dry.     Capillary Refill: Capillary refill takes less than 2 seconds.  Neurological:     General: No focal deficit present.     Mental Status: He is alert and oriented to person, place, and time. Mental status is at baseline.  Psychiatric:        Mood and Affect: Mood normal.        Behavior: Behavior normal.        Thought Content: Thought content normal.        Judgment: Judgment normal.     Medical History: Medical History[1]  Patient Active Problem List   Diagnosis Date Noted  . Vitamin D deficiency 07/17/2024  . Cutaneous abscess of head excluding face 05/08/2024  . Periodontal disease 05/08/2024  . Itchy eyes 05/08/2024  . B12 deficiency 05/04/2024  . GI bleeding 04/15/2024  . Hypokalemia 04/03/2024  . Essential hypertension 04/02/2024  . Pulmonary embolism    (CMD) 04/02/2024  . 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 Feces contents abnormal 03/16/2024  . Gastroesophageal reflux disease 03/16/2024  . History of colonic polyps 03/16/2024  . Hernia of anterior abdominal wall 03/16/2024  . Folate deficiency 03/16/2024  . Major depressive disorder with single episode, in partial remission 07/15/2023  . Encounter for antineoplastic immunotherapy 11/28/2022  . 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 Metastasis to brain (HCC) 05/18/2022  . Malignant neoplasm of upper lobe of left lung (HCC) 08/10/2020  . Dehydration 07/05/2020  . Chemotherapy follow-up examination 05/09/2020  . SOB (shortness of breath) 05/09/2020  . Malignant neoplasm of upper lobe of right lung (HCC) 04/12/2020  . S/P lobectomy of lung 03/25/2020  . Multiple lung nodules on CT 02/10/2020  . Leukocytosis 06/27/2018  . Erectile  dysfunction 01/02/2018  . Chemotherapy management, encounter for 06/20/2016  . Mixed hyperlipidemia 05/16/2016   Current Medications:  Medications Ordered Prior to Encounter[2] Current Medications[3]  Allergies: Allergies[4]   Immunizations:  Immunization History  Administered Date(s) Administered  . Influenza Vaccine, Quadrivalent, Adjuvanted 10/08/2022  . Influenza, Injectable, Quadrivalent, Preservative Free 09/08/2018, 08/22/2019, 09/14/2020  . Influenza, Unspecified 08/28/2021  . Influenza, high-dose, trivalent, PF 10/02/2023  . Influenza,split virus, trivalent, PF 09/08/2016  . Pfizer COVID-19 12+yrs (aka Comirnaty) 10/02/2023  . Pfizer SARS-CoV-2 Bivalent 12+ yrs 11/12/2021  . Pfizer SARS-CoV-2 Primary Series 12+ yrs 04/10/2020, 05/01/2020, 11/14/2020, 05/20/2021, 10/08/2022  . Pneumococcal Conjugate Vaccine 20-Valent (PREVNAR-20) 6 wks+ 08/28/2021, 10/08/2022  . Pneumococcal Polysaccharide Vaccine, 23 Valent (PNEUMOVAX-23) 2Y+ 05/03/2018, 10/11/2019  . Pneumococcal, Unspecified 08/28/2021  . RSV, PF (ABRYSVO) 10/02/2023  . TD vaccine (ADULT)PF(TENIVAC) 7Y+ 01/10/2021  . TDAP VACCINE (BOOSTRIX,ADACEL) 7Y+ 05/17/2011  . Varicella Z[facility]) 18Y+ 05/03/2018, 07/02/2018    Surgical History- Surgical History[5]  Family history- Family History[6] Social history- Social History[7]   Labs: No results found for this or any previous visit (from the  past week).    Assessment & Plan 1. Major depressive disorder with single episode, in partial remission (Primary) - Improved motivation and concentration since the Lexapro dosage was increased. - No side effects such as headaches, nausea, or trouble sleeping were noted. - The current dose of Lexapro 15 mg will be maintained to avoid potential side effects and worsening depression. If symptoms worsen, he should inform the office so the dosage can be adjusted to 20 mg.  2. Essential hypertension - Blood pressure readings at  home are higher than those recorded in the office, suggesting a possible inaccuracy in his home blood pressure cuff. - The current antihypertensive medication regimen will be continued without any changes. He is advised to bring his home blood pressure cuff to the next visit for comparison.  3. Hematuria, unspecified type - He reported passing a dark-colored blood clot last week, accompanied by urinary urgency and difficulty urinating. Unable to provide urine sample in office. He will return with sample later today for evaluation.  Follow-up: A follow-up appointment is scheduled for 6 months from now.     Problem List Items Addressed This Visit     Major depressive disorder with single episode, in partial remission - Primary   Essential hypertension   Other Visit Diagnoses       Hematuria, unspecified type           Monitor: The problem is stable Evaluation: Labs/tests ordered, see encounter summary Assessment/Treatment Follow up in 6 months  Please contact my office for worsening conditions or problems, and seek emergency medical treatment and/or call 911 if you or your family deems either necessary.  Patient Instructions  Treatment Plan: Continue Lexapro at 15 mg to maintain improvements in depression without side effects. Continue current antihypertensive medication regimen; bring home blood pressure cuff to next visit for comparison. Collect urine sample today to rule out infection related to hematuria.   Follow-Up Instructions: Maintain current Lexapro dosage; inform the office if symptoms worsen. Bring home blood pressure cuff to next visit for accuracy check. Provide urine sample today; if infection is suspected, sample will be sent for culture. Schedule follow-up appointment in 6 months.  Return in about 6 months (around 02/24/2025) for med ck.  Portions of this note were created using [employer] Copilot software. Although proofread, errors may be present.  Natalie Wilson  James, PA-C 10:11 AM      [1] Past Medical History: Diagnosis Date  . Depression   . Elevated ALT measurement 11/09/2016  . Erectile dysfunction 01/02/2018  . GERD (gastroesophageal reflux disease)   . Hyperlipidemia 05/16/2016  . Kidney stone    history  . Leukocytosis 06/27/2018  . Lung cancer    (CMD)   . 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 Multiple lung nodules on CT 02/10/2020  . Prediabetes 11/09/2016  . Snores   . 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 Tobacco use disorder 06/20/2016  . Wears dentures    UPPER ONLY  . Wears glasses   [2] Current Outpatient Medications on File Prior to Visit  Medication Sig Dispense Refill  . albuterol  HFA (PROVENTIL  HFA;VENTOLIN  HFA;PROAIR  HFA) 90 mcg/actuation inhaler Inhale 2 puffs every 6 (six) hours as needed for wheezing or shortness of breath. 1 each 11  . amLODIPine (NORVASC) 5 mg tablet TAKE 1 TABLET(5 MG) BY MOUTH DAILY 90 tablet 0  . apixaban (ELIQUIS) 5 mg tab Take 1 tablet (5 mg total)  by mouth 2 (two) times a day. 60 tablet 5  . cholecalciferol (VITAMIN D3) 1,000 unit (25 mcg) tablet Take 1,000 Units by mouth daily.    . cyanocobalamin (VITAMIN B12) 1,000 mcg tablet Take 1,000 mcg by mouth Once Daily.    . dexAMETHasone  (DECADRON ) 4 mg tablet Take 2 tablets (8 mg total) by mouth in the morning and 2 tablets (8 mg total) in the evening. Take with meals. Take prescribed dose in the morning and at 2 pm with food on the day BEFORE and the day AFTER Taxane infusion.. 72 tablet 1  . escitalopram (LEXAPRO) 10 mg tablet Take 1.5 tablets (15 mg total) by mouth daily. 135 tablet 1  . ferrous sulfate 325 mg (65 mg iron) tablet Take 1 tablet (325 mg total) by mouth daily before breakfast. 90 tablet 5  . folic acid  (FOLVITE ) 1 mg tablet TAKE 1 TABLET(1 MG) BY MOUTH DAILY 30 tablet 3  . losartan (COZAAR) 100 mg tablet TAKE 1 TABLET(100 MG) BY MOUTH DAILY 90 tablet 0  . olopatadine (Pataday Twice  Daily Relief) 0.1 % ophthalmic solution Administer 1 drop into each eyes 2 (two) times a day. (Patient taking differently: Administer 1 drop into both eyes 2 (two) times a day as needed.) 5 mL 2  . pantoprazole  (PROTONIX ) 40 mg EC tablet TAKE 1 TABLET BY MOUTH TWICE DAILY FOR 7 DAYS, THEN 1 TABLET DAILY 90 tablet 2  . rosuvastatin (CRESTOR) 5 mg tablet TAKE 1 TABLET(5 MG) BY MOUTH EVERY NIGHT 90 tablet 0  . traMADoL (ULTRAM) 50 mg tablet Take 1 tablet (50 mg total) by mouth every 6 (six) hours as needed for moderate pain (4-6). 100 tablet 0   No current facility-administered medications on file prior to visit.  [3]  Current Outpatient Medications:  .  albuterol  HFA (PROVENTIL  HFA;VENTOLIN  HFA;PROAIR  HFA) 90 mcg/actuation inhaler, Inhale 2 puffs every 6 (six) hours as needed for wheezing or shortness of breath., Disp: 1 each, Rfl: 11 .  amLODIPine (NORVASC) 5 mg tablet, TAKE 1 TABLET(5 MG) BY MOUTH DAILY, Disp: 90 tablet, Rfl: 0 .  apixaban (ELIQUIS) 5 mg tab, Take 1 tablet (5 mg total) by mouth 2 (two) times a day., Disp: 60 tablet, Rfl: 5 .  cholecalciferol (VITAMIN D3) 1,000 unit (25 mcg) tablet, Take 1,000 Units by mouth daily., Disp: , Rfl:  .  cyanocobalamin (VITAMIN B12) 1,000 mcg tablet, Take 1,000 mcg by mouth Once Daily., Disp: , Rfl:  .  dexAMETHasone  (DECADRON ) 4 mg tablet, Take 2 tablets (8 mg total) by mouth in the morning and 2 tablets (8 mg total) in the evening. Take with meals. Take prescribed dose in the morning and at 2 pm with food on the day BEFORE and the day AFTER Taxane infusion.., Disp: 72 tablet, Rfl: 1 .  escitalopram (LEXAPRO) 10 mg tablet, Take 1.5 tablets (15 mg total) by mouth daily., Disp: 135 tablet, Rfl: 1 .  ferrous sulfate 325 mg (65 mg iron) tablet, Take 1 tablet (325 mg total) by mouth daily before breakfast., Disp: 90 tablet, Rfl: 5 .  folic acid  (FOLVITE ) 1 mg tablet, TAKE 1 TABLET(1 MG) BY MOUTH DAILY, Disp: 30 tablet, Rfl: 3 .  losartan (COZAAR) 100 mg  tablet, TAKE 1 TABLET(100 MG) BY MOUTH DAILY, Disp: 90 tablet, Rfl: 0 .  olopatadine (Pataday Twice Daily Relief) 0.1 % ophthalmic solution, Administer 1 drop into each eyes 2 (two) times a day. (Patient taking differently: Administer 1 drop into both eyes 2 (two)  times a day as needed.), Disp: 5 mL, Rfl: 2 .  pantoprazole  (PROTONIX ) 40 mg EC tablet, TAKE 1 TABLET BY MOUTH TWICE DAILY FOR 7 DAYS, THEN 1 TABLET DAILY, Disp: 90 tablet, Rfl: 2 .  rosuvastatin (CRESTOR) 5 mg tablet, TAKE 1 TABLET(5 MG) BY MOUTH EVERY NIGHT, Disp: 90 tablet, Rfl: 0 .  traMADoL (ULTRAM) 50 mg tablet, Take 1 tablet (50 mg total) by mouth every 6 (six) hours as needed for moderate pain (4-6)., Disp: 100 tablet, Rfl: 0 [4] No Known Allergies [5] Past Surgical History: Procedure Laterality Date  . CARDIAC CATHETERIZATION     Procedure: CARDIAC CATHETERIZATION  . COLONOSCOPY  08/10/2013   Procedure: COLONOSCOPY; Dr. Kristie- Repeat in 5 years  . LUNG LOBECTOMY Right 03/23/2020   Procedure: THORACOTOMY / LOBECTOMY;  Surgeon: Lamar Curtistine Server, DO;  Location: [facility] MAIN OR;  Service: Cardiothoracic;  Laterality: Right;  . LUNG SURGERY Right 03/23/2020   Procedure: WEDGE RESECTION ROBOTIC;  Surgeon: Lamar Curtistine Server, DO;  Location: [facility] MAIN OR;  Service: Cardiothoracic;  Laterality: Right;  [6] Family History Problem Relation Name Age of Onset  . Liver cancer Mother    . Lung disease Father    . Diabetes Father    . Cerebral palsy Sister    [7] Social History Socioeconomic History  . Marital status: Divorced  Tobacco Use  . Smoking status: Every Day    Current packs/day: 1.00    Types: Cigarettes    Passive exposure: Current  . Smokeless tobacco: Former  Advertising account planner  . Vaping status: Never Used  Substance and Sexual Activity  . Alcohol  use: Not Currently    Alcohol /week: 35.0 standard drinks of alcohol   . Drug use: No   Social Drivers of Health   Food Insecurity: Low Risk  (08/24/2024)   Food  vital sign   . Within the past 12 months, you worried that your food would run out before you got money to buy more: Never true   . Within the past 12 months, the food you bought just didn't last and you didn't have money to get more: Never true  Transportation Needs: No Transportation Needs (08/24/2024)   Transportation   . In the past 12 months, has lack of reliable transportation kept you from medical appointments, meetings, work or from getting things needed for daily living? : No  Safety: Low Risk  (08/24/2024)   Safety   . How often does anyone, including family and friends, physically hurt you?: Never   . How often does anyone, including family and friends, insult or talk down to you?: Never   . How often does anyone, including family and friends, threaten you with harm?: Never   . How often does anyone, including family and friends, scream or curse at you?: Never  Living Situation: Low Risk  (08/24/2024)   Living Situation   . What is your living situation today?: I have a steady place to live   . Think about the place you live. Do you have problems with any of the following? Choose all that apply:: None/None on this list

## 2024-08-26 NOTE — Progress Notes (Signed)
 Please call to let him know his urine shows sign of likely UTI. I am sending Macrobid antibiotics in. Once culture results, I'll let him know if it needs to change. Let me know if recurring or persistent UTI symptoms.

## 2024-08-26 NOTE — Progress Notes (Signed)
 Live Answer: When patient returns call, please communicate the follow information:  Live Answer can communication the following information: urine shows sign of likely UTI. I am sending Macrobid antibiotics in. Once culture results, I'll let him know if it needs to change. Let me know if recurring or persistent UTI symptoms.

## 2024-08-27 NOTE — Progress Notes (Signed)
 Attempted to reach pt, no answer, vm full, sent portal message. If pt returns call please read the following message   Live Answer: When patient returns call, please communicate the follow information:  Live Answer can communication the following information: Urine culture does not show any bacteria, therefore no urinary tract infection is present. I would like to recheck urine in about 1 week to see if there is still blood in the urine at that time. Please have him schedule a lab visit for this. He can discontinue Macrobid antibiotics since there is no infection present.

## 2024-08-27 NOTE — Progress Notes (Signed)
 Please call the patient to report the following: Urine culture does not show any bacteria, therefore no urinary tract infection is present.  I would like to recheck urine in about 1 week to see if there is still blood in the urine at that time.  Please have him schedule a lab visit for this.  I will do a urinalysis with microscopy, which I am ordering now.  He can discontinue Macrobid antibiotics since there is no infection present.

## 2024-09-07 NOTE — Progress Notes (Signed)
 Referral sent

## 2024-09-22 ENCOUNTER — Emergency Department (HOSPITAL_COMMUNITY)

## 2024-09-22 ENCOUNTER — Other Ambulatory Visit: Payer: Self-pay

## 2024-09-22 ENCOUNTER — Inpatient Hospital Stay (HOSPITAL_COMMUNITY): Admitting: Registered Nurse

## 2024-09-22 ENCOUNTER — Encounter (HOSPITAL_COMMUNITY): Payer: Self-pay

## 2024-09-22 ENCOUNTER — Encounter (HOSPITAL_COMMUNITY): Admission: EM | Disposition: E | Payer: Self-pay | Source: Home / Self Care

## 2024-09-22 ENCOUNTER — Inpatient Hospital Stay (HOSPITAL_COMMUNITY)
Admission: EM | Admit: 2024-09-22 | Discharge: 2024-09-30 | DRG: 957 | Disposition: E | Attending: Surgery | Admitting: Surgery

## 2024-09-22 ENCOUNTER — Other Ambulatory Visit (HOSPITAL_COMMUNITY)

## 2024-09-22 ENCOUNTER — Inpatient Hospital Stay (HOSPITAL_COMMUNITY)

## 2024-09-22 DIAGNOSIS — S2241XA Multiple fractures of ribs, right side, initial encounter for closed fracture: Secondary | ICD-10-CM | POA: Diagnosis present

## 2024-09-22 DIAGNOSIS — S06370A Contusion, laceration, and hemorrhage of cerebellum without loss of consciousness, initial encounter: Secondary | ICD-10-CM | POA: Diagnosis present

## 2024-09-22 DIAGNOSIS — S069XAA Unspecified intracranial injury with loss of consciousness status unknown, initial encounter: Secondary | ICD-10-CM | POA: Diagnosis not present

## 2024-09-22 DIAGNOSIS — Z79899 Other long term (current) drug therapy: Secondary | ICD-10-CM | POA: Diagnosis not present

## 2024-09-22 DIAGNOSIS — R7303 Prediabetes: Secondary | ICD-10-CM | POA: Diagnosis present

## 2024-09-22 DIAGNOSIS — S36115A Moderate laceration of liver, initial encounter: Secondary | ICD-10-CM | POA: Diagnosis present

## 2024-09-22 DIAGNOSIS — Y92410 Unspecified street and highway as the place of occurrence of the external cause: Secondary | ICD-10-CM

## 2024-09-22 DIAGNOSIS — S52614B Nondisplaced fracture of right ulna styloid process, initial encounter for open fracture type I or II: Secondary | ICD-10-CM | POA: Diagnosis present

## 2024-09-22 DIAGNOSIS — F419 Anxiety disorder, unspecified: Secondary | ICD-10-CM | POA: Diagnosis not present

## 2024-09-22 DIAGNOSIS — R4182 Altered mental status, unspecified: Secondary | ICD-10-CM | POA: Diagnosis present

## 2024-09-22 DIAGNOSIS — C7931 Secondary malignant neoplasm of brain: Secondary | ICD-10-CM | POA: Diagnosis present

## 2024-09-22 DIAGNOSIS — S066X0A Traumatic subarachnoid hemorrhage without loss of consciousness, initial encounter: Secondary | ICD-10-CM | POA: Diagnosis present

## 2024-09-22 DIAGNOSIS — C7949 Secondary malignant neoplasm of other parts of nervous system: Secondary | ICD-10-CM | POA: Diagnosis present

## 2024-09-22 DIAGNOSIS — S36899A Unspecified injury of other intra-abdominal organs, initial encounter: Secondary | ICD-10-CM | POA: Diagnosis present

## 2024-09-22 DIAGNOSIS — C781 Secondary malignant neoplasm of mediastinum: Secondary | ICD-10-CM | POA: Diagnosis present

## 2024-09-22 DIAGNOSIS — Z85841 Personal history of malignant neoplasm of brain: Secondary | ICD-10-CM | POA: Diagnosis not present

## 2024-09-22 DIAGNOSIS — R402362 Coma scale, best motor response, obeys commands, at arrival to emergency department: Secondary | ICD-10-CM | POA: Diagnosis present

## 2024-09-22 DIAGNOSIS — Z87898 Personal history of other specified conditions: Secondary | ICD-10-CM

## 2024-09-22 DIAGNOSIS — Z66 Do not resuscitate: Secondary | ICD-10-CM | POA: Diagnosis present

## 2024-09-22 DIAGNOSIS — I619 Nontraumatic intracerebral hemorrhage, unspecified: Secondary | ICD-10-CM | POA: Diagnosis present

## 2024-09-22 DIAGNOSIS — D62 Acute posthemorrhagic anemia: Secondary | ICD-10-CM | POA: Diagnosis not present

## 2024-09-22 DIAGNOSIS — E440 Moderate protein-calorie malnutrition: Secondary | ICD-10-CM | POA: Diagnosis present

## 2024-09-22 DIAGNOSIS — S92901B Unspecified fracture of right foot, initial encounter for open fracture: Secondary | ICD-10-CM

## 2024-09-22 DIAGNOSIS — C3490 Malignant neoplasm of unspecified part of unspecified bronchus or lung: Secondary | ICD-10-CM | POA: Diagnosis not present

## 2024-09-22 DIAGNOSIS — C3412 Malignant neoplasm of upper lobe, left bronchus or lung: Secondary | ICD-10-CM | POA: Diagnosis present

## 2024-09-22 DIAGNOSIS — S92411B Displaced fracture of proximal phalanx of right great toe, initial encounter for open fracture: Secondary | ICD-10-CM | POA: Diagnosis not present

## 2024-09-22 DIAGNOSIS — R569 Unspecified convulsions: Secondary | ICD-10-CM | POA: Diagnosis present

## 2024-09-22 DIAGNOSIS — I10 Essential (primary) hypertension: Secondary | ICD-10-CM | POA: Diagnosis present

## 2024-09-22 DIAGNOSIS — R402242 Coma scale, best verbal response, confused conversation, at arrival to emergency department: Secondary | ICD-10-CM | POA: Diagnosis present

## 2024-09-22 DIAGNOSIS — J9601 Acute respiratory failure with hypoxia: Secondary | ICD-10-CM | POA: Diagnosis not present

## 2024-09-22 DIAGNOSIS — G9340 Encephalopathy, unspecified: Secondary | ICD-10-CM | POA: Insufficient documentation

## 2024-09-22 DIAGNOSIS — Z681 Body mass index (BMI) 19 or less, adult: Secondary | ICD-10-CM

## 2024-09-22 DIAGNOSIS — R402142 Coma scale, eyes open, spontaneous, at arrival to emergency department: Secondary | ICD-10-CM | POA: Diagnosis present

## 2024-09-22 DIAGNOSIS — Z902 Acquired absence of lung [part of]: Secondary | ICD-10-CM

## 2024-09-22 DIAGNOSIS — F17210 Nicotine dependence, cigarettes, uncomplicated: Secondary | ICD-10-CM

## 2024-09-22 DIAGNOSIS — Z515 Encounter for palliative care: Secondary | ICD-10-CM | POA: Diagnosis not present

## 2024-09-22 DIAGNOSIS — R319 Hematuria, unspecified: Secondary | ICD-10-CM | POA: Diagnosis present

## 2024-09-22 DIAGNOSIS — S92401B Displaced unspecified fracture of right great toe, initial encounter for open fracture: Secondary | ICD-10-CM | POA: Diagnosis present

## 2024-09-22 DIAGNOSIS — Z7189 Other specified counseling: Secondary | ICD-10-CM | POA: Diagnosis not present

## 2024-09-22 DIAGNOSIS — C3411 Malignant neoplasm of upper lobe, right bronchus or lung: Secondary | ICD-10-CM | POA: Diagnosis present

## 2024-09-22 DIAGNOSIS — S36112A Contusion of liver, initial encounter: Secondary | ICD-10-CM | POA: Diagnosis not present

## 2024-09-22 DIAGNOSIS — Z23 Encounter for immunization: Secondary | ICD-10-CM | POA: Diagnosis present

## 2024-09-22 DIAGNOSIS — T07XXXA Unspecified multiple injuries, initial encounter: Secondary | ICD-10-CM

## 2024-09-22 DIAGNOSIS — S92531A Displaced fracture of distal phalanx of right lesser toe(s), initial encounter for closed fracture: Secondary | ICD-10-CM | POA: Diagnosis present

## 2024-09-22 DIAGNOSIS — Z85118 Personal history of other malignant neoplasm of bronchus and lung: Secondary | ICD-10-CM

## 2024-09-22 HISTORY — DX: Malignant (primary) neoplasm, unspecified: C80.1

## 2024-09-22 HISTORY — DX: Essential (primary) hypertension: I10

## 2024-09-22 HISTORY — PX: INCISION AND DRAINAGE OF WOUND: SHX1803

## 2024-09-22 LAB — COMPREHENSIVE METABOLIC PANEL WITH GFR
ALT: 146 U/L — ABNORMAL HIGH (ref 0–44)
AST: 173 U/L — ABNORMAL HIGH (ref 15–41)
Albumin: 2.8 g/dL — ABNORMAL LOW (ref 3.5–5.0)
Alkaline Phosphatase: 74 U/L (ref 38–126)
Anion gap: 12 (ref 5–15)
BUN: 16 mg/dL (ref 8–23)
CO2: 16 mmol/L — ABNORMAL LOW (ref 22–32)
Calcium: 8.6 mg/dL — ABNORMAL LOW (ref 8.9–10.3)
Chloride: 108 mmol/L (ref 98–111)
Creatinine, Ser: 2.56 mg/dL — ABNORMAL HIGH (ref 0.61–1.24)
GFR, Estimated: 27 mL/min — ABNORMAL LOW (ref >60)
Glucose, Bld: 155 mg/dL — ABNORMAL HIGH (ref 70–99)
Potassium: 4.5 mmol/L (ref 3.5–5.1)
Sodium: 136 mmol/L (ref 135–145)
Total Bilirubin: 0.5 mg/dL (ref 0.0–1.2)
Total Protein: 6.5 g/dL (ref 6.5–8.1)

## 2024-09-22 LAB — BASIC METABOLIC PANEL WITH GFR
Anion gap: 14 (ref 5–15)
BUN: 20 mg/dL (ref 8–23)
CO2: 13 mmol/L — ABNORMAL LOW (ref 22–32)
Calcium: 7.5 mg/dL — ABNORMAL LOW (ref 8.9–10.3)
Chloride: 108 mmol/L (ref 98–111)
Creatinine, Ser: 2.93 mg/dL — ABNORMAL HIGH (ref 0.61–1.24)
GFR, Estimated: 23 mL/min — ABNORMAL LOW (ref >60)
Glucose, Bld: 143 mg/dL — ABNORMAL HIGH (ref 70–99)
Potassium: 4.5 mmol/L (ref 3.5–5.1)
Sodium: 135 mmol/L (ref 135–145)

## 2024-09-22 LAB — I-STAT CHEM 8, ED
BUN: 18 mg/dL (ref 8–23)
Calcium, Ion: 1.06 mmol/L — ABNORMAL LOW (ref 1.15–1.40)
Chloride: 109 mmol/L (ref 98–111)
Creatinine, Ser: 2.6 mg/dL — ABNORMAL HIGH (ref 0.61–1.24)
Glucose, Bld: 160 mg/dL — ABNORMAL HIGH (ref 70–99)
HCT: 30 % — ABNORMAL LOW (ref 39.0–52.0)
Hemoglobin: 10.2 g/dL — ABNORMAL LOW (ref 13.0–17.0)
Potassium: 4.6 mmol/L (ref 3.5–5.1)
Sodium: 138 mmol/L (ref 135–145)
TCO2: 17 mmol/L — ABNORMAL LOW (ref 22–32)

## 2024-09-22 LAB — I-STAT CG4 LACTIC ACID, ED: Lactic Acid, Venous: 3.4 mmol/L (ref 0.5–1.9)

## 2024-09-22 LAB — POCT I-STAT 7, (LYTES, BLD GAS, ICA,H+H)
Acid-base deficit: 10 mmol/L — ABNORMAL HIGH (ref 0.0–2.0)
Bicarbonate: 15.9 mmol/L — ABNORMAL LOW (ref 20.0–28.0)
Calcium, Ion: 1.15 mmol/L (ref 1.15–1.40)
HCT: 33 % — ABNORMAL LOW (ref 39.0–52.0)
Hemoglobin: 11.2 g/dL — ABNORMAL LOW (ref 13.0–17.0)
O2 Saturation: 96 %
Potassium: 5.4 mmol/L — ABNORMAL HIGH (ref 3.5–5.1)
Sodium: 139 mmol/L (ref 135–145)
TCO2: 17 mmol/L — ABNORMAL LOW (ref 22–32)
pCO2 arterial: 32.8 mmHg (ref 32–48)
pH, Arterial: 7.293 — ABNORMAL LOW (ref 7.35–7.45)
pO2, Arterial: 88 mmHg (ref 83–108)

## 2024-09-22 LAB — CBC
HCT: 31.4 % — ABNORMAL LOW (ref 39.0–52.0)
HCT: 18.9 % — ABNORMAL LOW (ref 39.0–52.0)
Hemoglobin: 9.8 g/dL — ABNORMAL LOW (ref 13.0–17.0)
Hemoglobin: 5.7 g/dL — CL (ref 13.0–17.0)
MCH: 29.7 pg (ref 26.0–34.0)
MCH: 29.8 pg (ref 26.0–34.0)
MCHC: 31.2 g/dL (ref 30.0–36.0)
MCHC: 30.2 g/dL (ref 30.0–36.0)
MCV: 95.2 fL (ref 80.0–100.0)
MCV: 99 fL (ref 80.0–100.0)
Platelets: 357 K/uL (ref 150–400)
Platelets: 234 K/uL (ref 150–400)
RBC: 3.3 MIL/uL — ABNORMAL LOW (ref 4.22–5.81)
RBC: 1.91 MIL/uL — ABNORMAL LOW (ref 4.22–5.81)
RDW: 16.2 % — ABNORMAL HIGH (ref 11.5–15.5)
RDW: 16.3 % — ABNORMAL HIGH (ref 11.5–15.5)
WBC: 15.7 K/uL — ABNORMAL HIGH (ref 4.0–10.5)
WBC: 20.4 K/uL — ABNORMAL HIGH (ref 4.0–10.5)
nRBC: 0 % (ref 0.0–0.2)
nRBC: 0 % (ref 0.0–0.2)

## 2024-09-22 LAB — TROPONIN I (HIGH SENSITIVITY)
Troponin I (High Sensitivity): 44 ng/L — ABNORMAL HIGH (ref <18)
Troponin I (High Sensitivity): 121 ng/L (ref <18)

## 2024-09-22 LAB — PREPARE RBC (CROSSMATCH)

## 2024-09-22 LAB — SAMPLE TO BLOOD BANK

## 2024-09-22 LAB — PROTIME-INR
INR: 1.7 — ABNORMAL HIGH (ref 0.8–1.2)
Prothrombin Time: 20.6 s — ABNORMAL HIGH (ref 11.4–15.2)

## 2024-09-22 LAB — LACTIC ACID, PLASMA: Lactic Acid, Venous: 5.1 mmol/L (ref 0.5–1.9)

## 2024-09-22 LAB — ABO/RH: ABO/RH(D): A POS

## 2024-09-22 LAB — GLUCOSE, CAPILLARY: Glucose-Capillary: 149 mg/dL — ABNORMAL HIGH (ref 70–99)

## 2024-09-22 LAB — MRSA NEXT GEN BY PCR, NASAL: MRSA by PCR Next Gen: DETECTED — AB

## 2024-09-22 LAB — ETHANOL: Alcohol, Ethyl (B): <15 mg/dL (ref <15)

## 2024-09-22 LAB — HIV ANTIBODY (ROUTINE TESTING W REFLEX): HIV Screen 4th Generation wRfx: NONREACTIVE

## 2024-09-22 SURGERY — IRRIGATION AND DEBRIDEMENT WOUND
Anesthesia: General | Laterality: Right

## 2024-09-22 MED ORDER — HYDRALAZINE HCL 20 MG/ML IJ SOLN: 10.0000 mg | INTRAMUSCULAR | Status: DC | PRN

## 2024-09-22 MED ORDER — METHOCARBAMOL 500 MG PO TABS: 500.0000 mg | ORAL_TABLET | Freq: Four times a day (QID) | ORAL | Status: DC | PRN

## 2024-09-22 MED ORDER — PROPOFOL 1000 MG/100ML IV EMUL
0.0000 ug/kg/min | INTRAVENOUS | Status: DC
  Administered 2024-09-22: 30 ug/kg/min via INTRAVENOUS

## 2024-09-22 MED ORDER — LIDOCAINE 5 % EX PTCH
1.0000 | MEDICATED_PATCH | CUTANEOUS | Status: DC
  Administered 2024-09-23 – 2024-09-28 (×6): 1 via TRANSDERMAL
  Filled 2024-09-22 (×6): qty 1

## 2024-09-22 MED ORDER — ORAL CARE MOUTH RINSE
15.0000 mL | OROMUCOSAL | Status: DC
  Administered 2024-09-23 – 2024-09-25 (×31): 15 mL via OROMUCOSAL

## 2024-09-22 MED ORDER — ORAL CARE MOUTH RINSE: 15.0000 mL | OROMUCOSAL | Status: DC | PRN

## 2024-09-22 MED ORDER — ONDANSETRON HCL 4 MG/2ML IJ SOLN
INTRAMUSCULAR | Status: AC
  Filled 2024-09-22: qty 8

## 2024-09-22 MED ORDER — MIDAZOLAM HCL 2 MG/2ML IJ SOLN
1.0000 mg | INTRAMUSCULAR | Status: DC | PRN
  Administered 2024-09-23 (×2): 2 mg via INTRAVENOUS
  Filled 2024-09-22 (×2): qty 2

## 2024-09-22 MED ORDER — PROPOFOL 1000 MG/100ML IV EMUL
INTRAVENOUS | Status: AC
  Filled 2024-09-22: qty 100

## 2024-09-22 MED ORDER — SODIUM CHLORIDE 0.9 % IV SOLN
INTRAVENOUS | Status: AC
Start: 2024-09-22 — End: 2024-09-24

## 2024-09-22 MED ORDER — TETANUS-DIPHTH-ACELL PERTUSSIS 5-2.5-18.5 LF-MCG/0.5 IM SUSY
0.5000 mL | PREFILLED_SYRINGE | Freq: Once | INTRAMUSCULAR | Status: AC
  Administered 2024-09-22: 0.5 mL via INTRAMUSCULAR

## 2024-09-22 MED ORDER — ONDANSETRON 4 MG PO TBDP: 4.0000 mg | ORAL_TABLET | Freq: Four times a day (QID) | ORAL | Status: DC | PRN

## 2024-09-22 MED ORDER — DOCUSATE SODIUM 100 MG PO CAPS: 100.0000 mg | ORAL_CAPSULE | Freq: Two times a day (BID) | ORAL | Status: DC

## 2024-09-22 MED ORDER — MIDAZOLAM HCL 2 MG/2ML IJ SOLN
INTRAMUSCULAR | Status: DC | PRN
  Administered 2024-09-22 (×2): 1 mg via INTRAVENOUS

## 2024-09-22 MED ORDER — DEXAMETHASONE SODIUM PHOSPHATE 10 MG/ML IJ SOLN
INTRAMUSCULAR | Status: AC
  Filled 2024-09-22: qty 4

## 2024-09-22 MED ORDER — ONDANSETRON HCL 4 MG/2ML IJ SOLN: 4.0000 mg | Freq: Four times a day (QID) | INTRAMUSCULAR | Status: DC | PRN

## 2024-09-22 MED ORDER — FENTANYL CITRATE PF 50 MCG/ML IJ SOSY
50.0000 ug | PREFILLED_SYRINGE | INTRAMUSCULAR | Status: DC | PRN
  Administered 2024-09-22: 75 ug via INTRAVENOUS
  Administered 2024-09-22: 50 ug via INTRAVENOUS
  Administered 2024-09-23: 100 ug via INTRAVENOUS
  Administered 2024-09-23: 75 ug via INTRAVENOUS

## 2024-09-22 MED ORDER — LEVETIRACETAM (KEPPRA) 500 MG/5 ML ADULT IV PUSH
500.0000 mg | Freq: Two times a day (BID) | INTRAVENOUS | Status: DC
  Administered 2024-09-23 – 2024-09-24 (×3): 500 mg via INTRAVENOUS
  Filled 2024-09-22 (×3): qty 5

## 2024-09-22 MED ORDER — SUCCINYLCHOLINE CHLORIDE 200 MG/10ML IV SOSY
PREFILLED_SYRINGE | INTRAVENOUS | Status: AC
  Filled 2024-09-22: qty 10

## 2024-09-22 MED ORDER — PHENYLEPHRINE 80 MCG/ML (10ML) SYRINGE FOR IV PUSH (FOR BLOOD PRESSURE SUPPORT)
PREFILLED_SYRINGE | INTRAVENOUS | Status: AC
  Filled 2024-09-22: qty 10

## 2024-09-22 MED ORDER — CEFAZOLIN SODIUM-DEXTROSE 1-4 GM/50ML-% IV SOLN
1.0000 g | Freq: Three times a day (TID) | INTRAVENOUS | Status: DC
  Filled 2024-09-22 (×2): qty 50

## 2024-09-22 MED ORDER — LIDOCAINE 2% (20 MG/ML) 5 ML SYRINGE
INTRAMUSCULAR | Status: AC
  Filled 2024-09-22: qty 15

## 2024-09-22 MED ORDER — METOPROLOL TARTRATE 5 MG/5ML IV SOLN: 5.0000 mg | Freq: Four times a day (QID) | INTRAVENOUS | Status: DC | PRN

## 2024-09-22 MED ORDER — CHLORHEXIDINE GLUCONATE 0.12 % MT SOLN
15.0000 mL | Freq: Once | OROMUCOSAL | Status: AC
  Administered 2024-09-22: 15 mL via OROMUCOSAL
  Filled 2024-09-22: qty 15

## 2024-09-22 MED ORDER — OXYCODONE HCL 5 MG PO TABS: 5.0000 mg | ORAL_TABLET | ORAL | Status: DC | PRN

## 2024-09-22 MED ORDER — PHENYLEPHRINE HCL-NACL 20-0.9 MG/250ML-% IV SOLN
INTRAVENOUS | Status: DC | PRN
  Administered 2024-09-22: 75 ug/min via INTRAVENOUS

## 2024-09-22 MED ORDER — KETAMINE HCL 50 MG/5ML IJ SOSY
PREFILLED_SYRINGE | INTRAMUSCULAR | Status: AC
  Filled 2024-09-22: qty 5

## 2024-09-22 MED ORDER — POLYETHYLENE GLYCOL 3350 17 G PO PACK
17.0000 g | PACK | Freq: Every day | ORAL | Status: DC
  Administered 2024-09-24 – 2024-09-25 (×2): 17 g
  Filled 2024-09-22 (×2): qty 1

## 2024-09-22 MED ORDER — ROCURONIUM BROMIDE 10 MG/ML (PF) SYRINGE
PREFILLED_SYRINGE | INTRAVENOUS | Status: AC
  Filled 2024-09-22: qty 40

## 2024-09-22 MED ORDER — FENTANYL BOLUS VIA INFUSION
25.0000 ug | INTRAVENOUS | Status: DC | PRN
  Administered 2024-09-23: 100 ug via INTRAVENOUS
  Administered 2024-09-23: 75 ug via INTRAVENOUS

## 2024-09-22 MED ORDER — ACETAMINOPHEN 500 MG PO TABS
1000.0000 mg | ORAL_TABLET | Freq: Four times a day (QID) | ORAL | Status: DC
  Administered 2024-09-23 – 2024-09-24 (×4): 1000 mg via ORAL
  Filled 2024-09-22 (×4): qty 2

## 2024-09-22 MED ORDER — CEFAZOLIN SODIUM 1 G IJ SOLR
INTRAMUSCULAR | Status: AC
  Filled 2024-09-22: qty 40

## 2024-09-22 MED ORDER — CEFAZOLIN SODIUM-DEXTROSE 2-4 GM/100ML-% IV SOLN
2.0000 g | Freq: Once | INTRAVENOUS | Status: AC
  Administered 2024-09-22: 2 g via INTRAVENOUS

## 2024-09-22 MED ORDER — EPHEDRINE 5 MG/ML INJ
INTRAVENOUS | Status: AC
  Filled 2024-09-22: qty 5

## 2024-09-22 MED ORDER — ALBUMIN HUMAN 5 % IV SOLN: INTRAVENOUS | Status: DC | PRN

## 2024-09-22 MED ORDER — ONDANSETRON HCL 4 MG/2ML IJ SOLN
INTRAMUSCULAR | Status: DC | PRN
  Administered 2024-09-22: 4 mg via INTRAVENOUS

## 2024-09-22 MED ORDER — IOHEXOL 350 MG/ML SOLN
75.0000 mL | Freq: Once | INTRAVENOUS | Status: AC | PRN
  Administered 2024-09-22: 75 mL via INTRAVENOUS

## 2024-09-22 MED ORDER — CEFAZOLIN SODIUM-DEXTROSE 1-4 GM/50ML-% IV SOLN
1.0000 g | Freq: Two times a day (BID) | INTRAVENOUS | Status: DC
  Administered 2024-09-23 – 2024-09-25 (×5): 1 g via INTRAVENOUS
  Filled 2024-09-22 (×5): qty 50

## 2024-09-22 MED ORDER — FENTANYL CITRATE (PF) 250 MCG/5ML IJ SOLN
INTRAMUSCULAR | Status: AC
  Filled 2024-09-22: qty 5

## 2024-09-22 MED ORDER — ALBUTEROL SULFATE HFA 108 (90 BASE) MCG/ACT IN AERS
INHALATION_SPRAY | RESPIRATORY_TRACT | Status: AC
  Filled 2024-09-22: qty 6.7

## 2024-09-22 MED ORDER — SODIUM CHLORIDE 0.9 % IV SOLN: INTRAVENOUS | Status: DC

## 2024-09-22 MED ORDER — MEPIVACAINE HCL (PF) 2 % IJ SOLN
INTRAMUSCULAR | Status: DC | PRN
  Administered 2024-09-22: 10 mL via EPIDURAL
  Administered 2024-09-22: 20 mL via EPIDURAL

## 2024-09-22 MED ORDER — POLYETHYLENE GLYCOL 3350 17 G PO PACK: 17.0000 g | PACK | Freq: Every day | ORAL | Status: DC | PRN

## 2024-09-22 MED ORDER — DOCUSATE SODIUM 50 MG/5ML PO LIQD
100.0000 mg | Freq: Two times a day (BID) | ORAL | Status: DC
  Administered 2024-09-23 – 2024-09-25 (×5): 100 mg
  Filled 2024-09-22 (×5): qty 10

## 2024-09-22 MED ORDER — DEXAMETHASONE SODIUM PHOSPHATE 10 MG/ML IJ SOLN
INTRAMUSCULAR | Status: DC | PRN
  Administered 2024-09-22: 10 mg via INTRAVENOUS

## 2024-09-22 MED ORDER — ORAL CARE MOUTH RINSE: 15.0000 mL | Freq: Once | OROMUCOSAL | Status: AC

## 2024-09-22 MED ORDER — MUPIROCIN 2 % EX OINT
1.0000 | TOPICAL_OINTMENT | Freq: Two times a day (BID) | CUTANEOUS | Status: DC
  Administered 2024-09-23 – 2024-09-25 (×5): 1 via NASAL
  Filled 2024-09-22: qty 22

## 2024-09-22 MED ORDER — MIDAZOLAM HCL 2 MG/2ML IJ SOLN
INTRAMUSCULAR | Status: AC
  Filled 2024-09-22: qty 2

## 2024-09-22 MED ORDER — PROTHROMBIN COMPLEX CONC HUMAN 500 UNITS IV KIT
2772.0000 [IU] | PACK | Status: AC
  Administered 2024-09-22: 2772 [IU] via INTRAVENOUS
  Filled 2024-09-22: qty 2772

## 2024-09-22 MED ORDER — CEFAZOLIN SODIUM-DEXTROSE 2-3 GM-%(50ML) IV SOLR
INTRAVENOUS | Status: DC | PRN
  Administered 2024-09-22: 2 g via INTRAVENOUS

## 2024-09-22 MED ORDER — FENTANYL 2500MCG IN NS 250ML (10MCG/ML) PREMIX INFUSION
0.0000 ug/h | INTRAVENOUS | Status: DC
  Administered 2024-09-22: 50 ug/h via INTRAVENOUS
  Administered 2024-09-23 – 2024-09-25 (×2): 75 ug/h via INTRAVENOUS
  Filled 2024-09-22 (×3): qty 250

## 2024-09-22 MED ORDER — BUPIVACAINE HCL (PF) 0.25 % IJ SOLN
INTRAMUSCULAR | Status: DC | PRN
  Administered 2024-09-22: 10 mL

## 2024-09-22 MED ORDER — LIDOCAINE HCL URETHRAL/MUCOSAL 2 % EX GEL
1.0000 | Freq: Once | CUTANEOUS | Status: AC
Start: 2024-09-23 — End: 2024-09-22
  Administered 2024-09-22: 1 via URETHRAL
  Filled 2024-09-22: qty 11

## 2024-09-22 MED ORDER — PANTOPRAZOLE SODIUM 40 MG IV SOLR
40.0000 mg | INTRAVENOUS | Status: DC
  Administered 2024-09-23 – 2024-09-25 (×3): 40 mg via INTRAVENOUS
  Filled 2024-09-22 (×3): qty 10

## 2024-09-22 MED ORDER — FENTANYL CITRATE PF 50 MCG/ML IJ SOSY
50.0000 ug | PREFILLED_SYRINGE | INTRAMUSCULAR | Status: DC | PRN
  Filled 2024-09-22: qty 1

## 2024-09-22 MED ORDER — FENTANYL CITRATE PF 50 MCG/ML IJ SOSY
25.0000 ug | PREFILLED_SYRINGE | Freq: Once | INTRAMUSCULAR | Status: AC
  Administered 2024-09-22: 50 ug via INTRAVENOUS
  Filled 2024-09-22: qty 1

## 2024-09-22 MED ORDER — PHENYLEPHRINE 80 MCG/ML (10ML) SYRINGE FOR IV PUSH (FOR BLOOD PRESSURE SUPPORT)
PREFILLED_SYRINGE | INTRAVENOUS | Status: DC | PRN
Start: 2024-09-22 — End: 2024-09-22
  Administered 2024-09-22: 320 ug via INTRAVENOUS
  Administered 2024-09-22: 480 ug via INTRAVENOUS

## 2024-09-22 MED ORDER — SODIUM CHLORIDE 0.9% IV SOLUTION: Freq: Once | INTRAVENOUS | Status: AC

## 2024-09-22 MED ORDER — CHLORHEXIDINE GLUCONATE CLOTH 2 % EX PADS
6.0000 | MEDICATED_PAD | Freq: Every day | CUTANEOUS | Status: AC
  Administered 2024-09-23 – 2024-09-25 (×5): 6 via TOPICAL

## 2024-09-22 SURGICAL SUPPLY — 35 items
ALCOHOL 70% 16 OZ (MISCELLANEOUS) ×1 IMPLANT
BAG COUNTER SPONGE SURGICOUNT (BAG) ×1 IMPLANT
BNDG COHESIVE 4X5 TAN STRL LF (GAUZE/BANDAGES/DRESSINGS) ×1 IMPLANT
CANISTER SUCTION 3000ML PPV (SUCTIONS) ×1 IMPLANT
COVER SURGICAL LIGHT HANDLE (MISCELLANEOUS) ×1 IMPLANT
DRAPE C-ARM 42X72 X-RAY (DRAPES) ×1 IMPLANT
DRAPE C-ARMOR (DRAPES) ×1 IMPLANT
DRAPE U-SHAPE 47X51 STRL (DRAPES) ×1 IMPLANT
DRSG ADAPTIC 3X8 NADH LF (GAUZE/BANDAGES/DRESSINGS) ×1 IMPLANT
DRSG TEGADERM 4X10 (GAUZE/BANDAGES/DRESSINGS) IMPLANT
DRSG TEGADERM 4X4.5 CHG (GAUZE/BANDAGES/DRESSINGS) IMPLANT
DRSG XEROFORM 1X8 (GAUZE/BANDAGES/DRESSINGS) IMPLANT
DURAPREP 26ML APPLICATOR (WOUND CARE) ×1 IMPLANT
ELECTRODE REM PT RTRN 9FT ADLT (ELECTROSURGICAL) ×1 IMPLANT
GAUZE PAD ABD 8X10 STRL (GAUZE/BANDAGES/DRESSINGS) ×1 IMPLANT
GAUZE SPONGE 4X4 12PLY STRL (GAUZE/BANDAGES/DRESSINGS) ×1 IMPLANT
GAUZE XEROFORM 1X8 LF (GAUZE/BANDAGES/DRESSINGS) IMPLANT
GLOVE BIO SURGEON STRL SZ7.5 (GLOVE) ×2 IMPLANT
GLOVE BIOGEL PI IND STRL 8 (GLOVE) ×2 IMPLANT
GOWN STRL REUS W/ TWL LRG LVL3 (GOWN DISPOSABLE) ×2 IMPLANT
GOWN STRL REUS W/ TWL XL LVL3 (GOWN DISPOSABLE) ×2 IMPLANT
KIT BASIN OR (CUSTOM PROCEDURE TRAY) ×1 IMPLANT
KIT TURNOVER KIT B (KITS) ×1 IMPLANT
PACK ORTHO EXTREMITY (CUSTOM PROCEDURE TRAY) ×1 IMPLANT
PAD ARMBOARD POSITIONER FOAM (MISCELLANEOUS) ×2 IMPLANT
PAD CAST 4YDX4 CTTN HI CHSV (CAST SUPPLIES) ×2 IMPLANT
PADDING CAST COTTON 6X4 STRL (CAST SUPPLIES) ×1 IMPLANT
SOLN 0.9% NACL 1000 ML (IV SOLUTION) ×1 IMPLANT
SOLN 0.9% NACL POUR BTL 1000ML (IV SOLUTION) ×1 IMPLANT
SUCTION TUBE FRAZIER 10FR DISP (SUCTIONS) ×1 IMPLANT
SUT ETHILON 3 0 PS 1 (SUTURE) ×1 IMPLANT
SUT VIC AB 2-0 CT1 TAPERPNT 27 (SUTURE) ×1 IMPLANT
TOWEL GREEN STERILE (TOWEL DISPOSABLE) ×2 IMPLANT
TUBE CONNECTING 12X1/4 (SUCTIONS) ×1 IMPLANT
YANKAUER SUCT BULB TIP NO VENT (SUCTIONS) ×1 IMPLANT
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
## 2024-09-22 NOTE — TOC CAGE-AID Note (Signed)
 Transition of Care [facility]) - CAGE-AID Screening  Patient Details  Name: Jerry Wall MRN: [id number] Date of Birth: January 15, 1955  Clinical Narrative:  Patient endorses every day cigarette use, per patient 1.5 ppd. Per patient he is an every other day drinker, has previously drank more but now drinks 1-2 beers every other day. Denies any illicit drug use. Patient has some confusion and answers may not be accurate.  CAGE-AID Screening:   Have You Ever Felt You Ought to Cut Down on Your Drinking or Drug Use?: Yes Have People Annoyed You By Critizing Your Drinking Or Drug Use?: No Have You Felt Bad Or Guilty About Your Drinking Or Drug Use?: Yes Have You Ever Had a Drink or Used Drugs First Thing In The Morning to Steady Your Nerves or to Get Rid of a Hangover?: No CAGE-AID Score: 2  Substance Abuse Education Offered: Yes

## 2024-09-22 NOTE — Progress Notes (Signed)
 PHARMACY NOTE:  ANTIMICROBIAL RENAL DOSAGE ADJUSTMENT  Current antimicrobial regimen includes a mismatch between antimicrobial dosage and estimated renal function.  As per policy approved by the Pharmacy & Therapeutics and Medical Executive Committees, the antimicrobial dosage will be adjusted accordingly.  Current antimicrobial dosage:  Ancef  1g IV q8h  Indication: open toe fracture  Renal Function:  Estimated Creatinine Clearance: 21.8 mL/min (A) (by C-G formula based on SCr of 2.6 mg/dL (H)). []      On intermittent HD, scheduled: []      On CRRT    Antimicrobial dosage has been changed to:  1g IV q12h  Additional comments:   Thank you for allowing pharmacy to be a part of this patient's care.  Rocky Slade, PharmD, BCPS 09/22/2024 8:36 PM
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
## 2024-09-22 NOTE — Anesthesia Procedure Notes (Signed)
 Procedure Name: Intubation Date/Time: 09/22/2024 7:13 PM  Performed by: Roddie Grate, CRNAPre-anesthesia Checklist: Patient identified, Emergency Drugs available, Suction available, Patient being monitored and Timeout performed Patient Re-evaluated:Patient Re-evaluated prior to induction Oxygen Delivery Method: Circle system utilized Preoxygenation: Pre-oxygenation with 100% oxygen Induction Type: Rapid sequence and Cricoid Pressure applied Laryngoscope Size: Glidescope and 4 Grade View: Grade I Tube type: Oral Tube size: 7.5 mm Number of attempts: 1 Airway Equipment and Method: Stylet and Video-laryngoscopy Placement Confirmation: ETT inserted through vocal cords under direct vision, breath sounds checked- equal and bilateral and CO2 detector Secured at: 23 cm Tube secured with: Tape Dental Injury: Teeth and Oropharynx as per pre-operative assessment

## 2024-09-22 NOTE — Anesthesia Procedure Notes (Addendum)
 Arterial Line Insertion Start/End9/23/2025 7:30 PM, 09/22/2024 7:39 PM Performed by: Darlyn Rush, MD, anesthesiologist  Patient location: Pre-op. Preanesthetic checklist: patient identified, IV checked, site marked, risks and benefits discussed, surgical consent, monitors and equipment checked, pre-op evaluation, timeout performed and anesthesia consent Lidocaine  1% used for infiltration Left, radial was placed Catheter size: 20 G Hand hygiene performed , maximum sterile barriers used  and Seldinger technique used  Attempts: 2 Procedure performed using ultrasound guided technique. Ultrasound Notes:anatomy identified, needle tip was noted to be adjacent to the nerve/plexus identified and no ultrasound evidence of intravascular and/or intraneural injection Following insertion, dressing applied and Biopatch. Post procedure assessment: normal and unchanged  Post procedure complications: second provider assisted and unsuccessful attempts. Patient tolerated the procedure well with no immediate complications.

## 2024-09-22 NOTE — Progress Notes (Signed)
 Patient intubated in OR with some concern for aspiration.  Admitted to ICU intubated and sedated.  Will monitor respiratory status overnight and consider extubation in the morning.  Family and patient aware in preop that intubation and transfer to the ICU intubated was possible due to surgery and voiced consent if necessary per anesthesia team.  Wishes for DNR and DNI were in reference to emergent situations.  Despite intubation, will leave code status as DNR and DNI in the chart for now as discussed with family on admission.  Deward JINNY Foy, MD [facility]

## 2024-09-22 NOTE — Anesthesia Procedure Notes (Signed)
 Anesthesia Regional Block: Popliteal block   Pre-Anesthetic Checklist: , timeout performed,  Correct Patient, Correct Site, Correct Laterality,  Correct Procedure, Correct Position, site marked,  Risks and benefits discussed,  Surgical consent,  Pre-op evaluation,  At surgeon's request and post-op pain management  Laterality: Lower and Right  Prep: chloraprep       Needles:  Injection technique: Single-shot  Needle Type: Stimiplex     Needle Length: 10cm  Needle Gauge: 21     Additional Needles:   Procedures:,,,, ultrasound used (permanent image in chart),,   Motor weakness within 5 minutes.  Narrative:  Start time: 09/22/2024 6:20 PM End time: 09/22/2024 6:35 PM Injection made incrementally with aspirations every 5 mL.  Performed by: Personally  Anesthesiologist: Darlyn Rush, MD  Additional Notes: Nerve located and needle positioned with direct ultrasound guidance. Good perineural spread. Patient tolerated well.

## 2024-09-22 NOTE — Consult Note (Addendum)
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 Orthopedic Surgery [phone] 09/22/2024, 3:23 PM    Patient seen and examined by myself.  I agree with otherwise assessment and plan as outlined by Ozell Purchase, PA-C.  Selinda Belvie Sharl
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
## 2024-09-22 NOTE — Anesthesia Postprocedure Evaluation (Signed)
 Anesthesia Post Note  Patient: Jerry Wall  Procedure(s) Performed: IRRIGATION AND DEBRIDEMENT WOUND (Right)     Patient location during evaluation: ICU Anesthesia Type: General Level of consciousness: sedated and patient remains intubated per anesthesia plan Pain management: pain level controlled Vital Signs Assessment: post-procedure vital signs reviewed and stable Respiratory status: patient remains intubated per anesthesia plan and patient on ventilator - see flowsheet for VS Cardiovascular status: stable Anesthetic complications: no   No notable events documented.  Last Vitals:  Vitals:   09/22/24 2323 09/22/24 2330  BP:    Pulse: (!) 105 (!) 107  Resp: (!) 26 20  Temp: (!) 36.2 C (!) 36.3 C  SpO2: 98% 100%    Last Pain:  Vitals:   09/22/24 2245  TempSrc: (P) Esophageal                 Norleen Pope

## 2024-09-22 NOTE — Transfer of Care (Signed)
 Immediate Anesthesia Transfer of Care Note  Patient: Jerry Wall  Procedure(s) Performed: IRRIGATION AND DEBRIDEMENT WOUND (Right)  Patient Location: ICU  Anesthesia Type:General  Level of Consciousness: Patient remains intubated per anesthesia plan  Airway & Oxygen Therapy: Patient remains intubated per anesthesia plan, Patient placed on Ventilator (see vital sign flow sheet for setting), and Pt required emergent intubation intra-op. This was a possibility pre-op and patient verbalized consent for ETT if medically necessary prior to surgery.   Post-op Assessment: Report given to RN and Post -op Vital signs reviewed and stable  Post vital signs: Reviewed and stable  Last Vitals:  Vitals Value Taken Time  BP 131/73 09/22/24 20:02  Temp    Pulse 100 09/22/24 20:02  Resp 28 09/22/24 20:02  SpO2 100 % 09/22/24 20:02  Vitals shown include unfiled device data.  Last Pain:  Vitals:   09/22/24 1732  TempSrc: Oral         Complications: No notable events documented.

## 2024-09-22 NOTE — ED Notes (Signed)
 CCMD called.
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
## 2024-09-22 NOTE — Brief Op Note (Signed)
 09/22/2024  7:38 PM  PATIENT:  Rolan SHAUNNA Christians  69 y.o. male  PRE-OPERATIVE DIAGNOSIS:  Right Great Toe Fracture  POST-OPERATIVE DIAGNOSIS:  Right Great Toe Fracture  PROCEDURE:  Procedure(s) with comments: IRRIGATION AND DEBRIDEMENT WOUND (Right) - Irrigation and Debridement Right Great Toe with Possible Pinning  SURGEON:  Surgeons and Role:    * Sharl, Selinda Dover, MD - Primary    * Stechschulte, Deward PARAS, MD - Assisting  PHYSICIAN ASSISTANT: Dayle Moores, PA-C   ANESTHESIA:   local, regional, and general  EBL:  10 cc  BLOOD ADMINISTERED:none  DRAINS: none   LOCAL MEDICATIONS USED:  MARCAINE      SPECIMEN:  No Specimen  DISPOSITION OF SPECIMEN: n/a  COUNTS:  YES  TOURNIQUET:  * Missing tourniquet times found for documented tourniquets in log: [id number] *  DICTATION: .Note written in EPIC  PLAN OF CARE: Discharge to home after PACU  PATIENT DISPOSITION:  ICU - intubated and critically ill.   Delay start of Pharmacological VTE agent (>24hrs) due to surgical blood loss or risk of bleeding: not applicable
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
## 2024-09-22 NOTE — Progress Notes (Signed)
 Orthopedic Tech Progress Note Patient Details:  Jerry Wall 12/26/1955 [id number]  Ortho Devices Type of Ortho Device: Velcro wrist splint Ortho Device/Splint Location: RUE Ortho Device/Splint Interventions: Ordered, Application, Adjustment   Post Interventions Patient Tolerated: Well Instructions Provided: Care of device  Delanna LITTIE Pac 09/22/2024, 6:12 PM
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
## 2024-09-22 NOTE — Anesthesia Procedure Notes (Signed)
 Anesthesia Regional Block: Adductor canal block   Pre-Anesthetic Checklist: , timeout performed,  Correct Patient, Correct Site, Correct Laterality,  Correct Procedure, Correct Position, site marked,  Risks and benefits discussed,  Surgical consent,  Pre-op evaluation,  At surgeon's request and post-op pain management  Laterality: Lower and Right  Prep: chloraprep       Needles:  Injection technique: Single-shot  Needle Type: Stimiplex     Needle Length: 9cm  Needle Gauge: 21     Additional Needles:   Procedures:,,,, ultrasound used (permanent image in chart),,    Narrative:  Start time: 09/22/2024 6:35 PM End time: 09/22/2024 6:40 PM Injection made incrementally with aspirations every 5 mL.  Performed by: Personally  Anesthesiologist: Darlyn Rush, MD  Additional Notes: BP cuff, EKG monitors applied. Sedation begun. Artery and nerve location verified with ultrasound. Anesthetic injected incrementally (5ml), slowly, and after negative aspirations under direct u/s guidance. Good fascial/perineural spread. Tolerated well.
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
## 2024-09-23 ENCOUNTER — Inpatient Hospital Stay (HOSPITAL_COMMUNITY)

## 2024-09-23 ENCOUNTER — Encounter (HOSPITAL_COMMUNITY): Payer: Self-pay | Admitting: Orthopedic Surgery

## 2024-09-23 DIAGNOSIS — I619 Nontraumatic intracerebral hemorrhage, unspecified: Secondary | ICD-10-CM | POA: Diagnosis not present

## 2024-09-23 DIAGNOSIS — S069XAA Unspecified intracranial injury with loss of consciousness status unknown, initial encounter: Secondary | ICD-10-CM

## 2024-09-23 DIAGNOSIS — R569 Unspecified convulsions: Secondary | ICD-10-CM | POA: Diagnosis not present

## 2024-09-23 LAB — URINALYSIS, ROUTINE W REFLEX MICROSCOPIC
Bacteria, UA: NONE SEEN
Bilirubin Urine: NEGATIVE
Glucose, UA: NEGATIVE mg/dL
Ketones, ur: NEGATIVE mg/dL
Leukocytes,Ua: NEGATIVE
Nitrite: NEGATIVE
Protein, ur: 30 mg/dL — AB
Specific Gravity, Urine: 1.025 (ref 1.005–1.030)
pH: 6 (ref 5.0–8.0)

## 2024-09-23 LAB — CBC
HCT: 31 % — ABNORMAL LOW (ref 39.0–52.0)
HCT: 26.9 % — ABNORMAL LOW (ref 39.0–52.0)
Hemoglobin: 9.9 g/dL — ABNORMAL LOW (ref 13.0–17.0)
Hemoglobin: 8.9 g/dL — ABNORMAL LOW (ref 13.0–17.0)
MCH: 28.4 pg (ref 26.0–34.0)
MCH: 29.1 pg (ref 26.0–34.0)
MCHC: 31.9 g/dL (ref 30.0–36.0)
MCHC: 33.1 g/dL (ref 30.0–36.0)
MCV: 89.1 fL (ref 80.0–100.0)
MCV: 87.9 fL (ref 80.0–100.0)
Platelets: 188 K/uL (ref 150–400)
Platelets: 177 K/uL (ref 150–400)
RBC: 3.48 MIL/uL — ABNORMAL LOW (ref 4.22–5.81)
RBC: 3.06 MIL/uL — ABNORMAL LOW (ref 4.22–5.81)
RDW: 18.8 % — ABNORMAL HIGH (ref 11.5–15.5)
RDW: 19.8 % — ABNORMAL HIGH (ref 11.5–15.5)
WBC: 15.1 K/uL — ABNORMAL HIGH (ref 4.0–10.5)
WBC: 18.7 K/uL — ABNORMAL HIGH (ref 4.0–10.5)
nRBC: 0 % (ref 0.0–0.2)
nRBC: 0 % (ref 0.0–0.2)

## 2024-09-23 LAB — TROPONIN I (HIGH SENSITIVITY)
Troponin I (High Sensitivity): 102 ng/L (ref <18)
Troponin I (High Sensitivity): 93 ng/L — ABNORMAL HIGH (ref <18)

## 2024-09-23 LAB — COMPREHENSIVE METABOLIC PANEL WITH GFR
ALT: 95 U/L — ABNORMAL HIGH (ref 0–44)
AST: 138 U/L — ABNORMAL HIGH (ref 15–41)
Albumin: 3.3 g/dL — ABNORMAL LOW (ref 3.5–5.0)
Alkaline Phosphatase: 48 U/L (ref 38–126)
Anion gap: 10 (ref 5–15)
BUN: 24 mg/dL — ABNORMAL HIGH (ref 8–23)
CO2: 17 mmol/L — ABNORMAL LOW (ref 22–32)
Calcium: 7.8 mg/dL — ABNORMAL LOW (ref 8.9–10.3)
Chloride: 110 mmol/L (ref 98–111)
Creatinine, Ser: 2.91 mg/dL — ABNORMAL HIGH (ref 0.61–1.24)
GFR, Estimated: 23 mL/min — ABNORMAL LOW (ref >60)
Glucose, Bld: 132 mg/dL — ABNORMAL HIGH (ref 70–99)
Potassium: 5.9 mmol/L — ABNORMAL HIGH (ref 3.5–5.1)
Sodium: 137 mmol/L (ref 135–145)
Total Bilirubin: 1.1 mg/dL (ref 0.0–1.2)
Total Protein: 5.8 g/dL — ABNORMAL LOW (ref 6.5–8.1)

## 2024-09-23 LAB — TRIGLYCERIDES: Triglycerides: 115 mg/dL (ref <150)

## 2024-09-23 LAB — MAGNESIUM: Magnesium: 2 mg/dL (ref 1.7–2.4)

## 2024-09-23 MED ORDER — GADOBUTROL 1 MMOL/ML IV SOLN
5.7000 mL | Freq: Once | INTRAVENOUS | Status: AC | PRN
  Administered 2024-09-23: 5.7 mL via INTRAVENOUS

## 2024-09-23 MED ORDER — HYDRALAZINE HCL 20 MG/ML IJ SOLN
10.0000 mg | INTRAMUSCULAR | Status: DC | PRN
  Administered 2024-09-23: 10 mg via INTRAVENOUS
  Filled 2024-09-23: qty 1

## 2024-09-23 MED ORDER — FENTANYL BOLUS VIA INFUSION
25.0000 ug | INTRAVENOUS | Status: DC | PRN
  Administered 2024-09-23 – 2024-09-24 (×8): 50 ug via INTRAVENOUS
  Administered 2024-09-24 (×2): 25 ug via INTRAVENOUS
  Administered 2024-09-24 – 2024-09-25 (×5): 50 ug via INTRAVENOUS
  Administered 2024-09-25: 25 ug via INTRAVENOUS
  Administered 2024-09-25: 50 ug via INTRAVENOUS

## 2024-09-23 MED ORDER — SODIUM ZIRCONIUM CYCLOSILICATE 10 G PO PACK
10.0000 g | PACK | Freq: Once | ORAL | Status: AC
  Administered 2024-09-23: 10 g
  Filled 2024-09-23: qty 1

## 2024-09-23 NOTE — Progress Notes (Signed)
 Pt transported  to MRI and back to 4n25 without complications.

## 2024-09-23 NOTE — Plan of Care (Signed)
 Patient vitals remain stable.  Get hypertensive when agitated.  Gave prn hypertensive's X1 but would get his pressure low when calm.  Daughter at bedside most of day.

## 2024-09-23 NOTE — Progress Notes (Signed)
   09/23/24 0746  Daily Weaning Assessment  Daily Assessment of Readiness to Wean Wean protocol criteria met (SBT performed)  SBT Method CPAP 5 cm H20 and PS 5 cm H20  Weaning Start Time 0746  Patient response Passed (Tolerated well)
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
## 2024-09-23 NOTE — Progress Notes (Signed)
 EEG complete - results pending
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
## 2024-09-24 ENCOUNTER — Inpatient Hospital Stay (HOSPITAL_COMMUNITY)

## 2024-09-24 DIAGNOSIS — I619 Nontraumatic intracerebral hemorrhage, unspecified: Secondary | ICD-10-CM | POA: Diagnosis not present

## 2024-09-24 DIAGNOSIS — R569 Unspecified convulsions: Secondary | ICD-10-CM | POA: Diagnosis not present

## 2024-09-24 DIAGNOSIS — S069XAA Unspecified intracranial injury with loss of consciousness status unknown, initial encounter: Secondary | ICD-10-CM | POA: Diagnosis not present

## 2024-09-24 LAB — COMPREHENSIVE METABOLIC PANEL WITH GFR
ALT: 53 U/L — ABNORMAL HIGH (ref 0–44)
AST: 70 U/L — ABNORMAL HIGH (ref 15–41)
Albumin: 2.5 g/dL — ABNORMAL LOW (ref 3.5–5.0)
Alkaline Phosphatase: 41 U/L (ref 38–126)
Anion gap: 12 (ref 5–15)
BUN: 40 mg/dL — ABNORMAL HIGH (ref 8–23)
CO2: 15 mmol/L — ABNORMAL LOW (ref 22–32)
Calcium: 7.5 mg/dL — ABNORMAL LOW (ref 8.9–10.3)
Chloride: 112 mmol/L — ABNORMAL HIGH (ref 98–111)
Creatinine, Ser: 3.42 mg/dL — ABNORMAL HIGH (ref 0.61–1.24)
GFR, Estimated: 19 mL/min — ABNORMAL LOW (ref >60)
Glucose, Bld: 109 mg/dL — ABNORMAL HIGH (ref 70–99)
Potassium: 4.4 mmol/L (ref 3.5–5.1)
Sodium: 139 mmol/L (ref 135–145)
Total Bilirubin: 0.7 mg/dL (ref 0.0–1.2)
Total Protein: 5 g/dL — ABNORMAL LOW (ref 6.5–8.1)

## 2024-09-24 LAB — PREPARE RBC (CROSSMATCH)

## 2024-09-24 LAB — CBC
HCT: 24.3 % — ABNORMAL LOW (ref 39.0–52.0)
Hemoglobin: 7.8 g/dL — ABNORMAL LOW (ref 13.0–17.0)
MCH: 28.9 pg (ref 26.0–34.0)
MCHC: 32.1 g/dL (ref 30.0–36.0)
MCV: 90 fL (ref 80.0–100.0)
Platelets: 149 K/uL — ABNORMAL LOW (ref 150–400)
RBC: 2.7 MIL/uL — ABNORMAL LOW (ref 4.22–5.81)
RDW: 20.1 % — ABNORMAL HIGH (ref 11.5–15.5)
WBC: 14.7 K/uL — ABNORMAL HIGH (ref 4.0–10.5)
nRBC: 0 % (ref 0.0–0.2)

## 2024-09-24 MED ORDER — SODIUM CHLORIDE 0.9 % IV SOLN
50.0000 mg | Freq: Once | INTRAVENOUS | Status: AC
  Administered 2024-09-24: 50 mg via INTRAVENOUS
  Filled 2024-09-24: qty 1

## 2024-09-24 MED ORDER — ALBUMIN HUMAN 5 % IV SOLN
25.0000 g | Freq: Once | INTRAVENOUS | Status: AC
  Administered 2024-09-25: 25 g via INTRAVENOUS
  Filled 2024-09-24: qty 500

## 2024-09-24 MED ORDER — SODIUM CHLORIDE 0.9% FLUSH
10.0000 mL | Freq: Two times a day (BID) | INTRAVENOUS | Status: DC
  Administered 2024-09-24 – 2024-09-28 (×9): 10 mL

## 2024-09-24 MED ORDER — IPRATROPIUM-ALBUTEROL 0.5-2.5 (3) MG/3ML IN SOLN
3.0000 mL | RESPIRATORY_TRACT | Status: DC | PRN
  Administered 2024-09-24 – 2024-09-25 (×2): 3 mL via RESPIRATORY_TRACT
  Filled 2024-09-24: qty 3

## 2024-09-24 MED ORDER — METHOCARBAMOL 500 MG PO TABS
500.0000 mg | ORAL_TABLET | Freq: Four times a day (QID) | ORAL | Status: DC | PRN
  Administered 2024-09-24 (×2): 500 mg
  Filled 2024-09-24 (×2): qty 1

## 2024-09-24 MED ORDER — SODIUM CHLORIDE 0.9 % IV BOLUS
1000.0000 mL | Freq: Once | INTRAVENOUS | Status: AC
  Administered 2024-09-24: 1000 mL via INTRAVENOUS

## 2024-09-24 MED ORDER — SODIUM CHLORIDE 0.9 % IV SOLN: INTRAVENOUS | Status: DC | PRN

## 2024-09-24 MED ORDER — LEVETIRACETAM (KEPPRA) 500 MG/5 ML ADULT IV PUSH
500.0000 mg | Freq: Two times a day (BID) | INTRAVENOUS | Status: DC
  Administered 2024-09-24 – 2024-09-28 (×9): 500 mg via INTRAVENOUS
  Filled 2024-09-24 (×9): qty 5

## 2024-09-24 MED ORDER — ACETAMINOPHEN 500 MG PO TABS
1000.0000 mg | ORAL_TABLET | Freq: Four times a day (QID) | ORAL | Status: DC
  Administered 2024-09-24 – 2024-09-25 (×4): 1000 mg
  Filled 2024-09-24 (×4): qty 2

## 2024-09-24 MED ORDER — SODIUM CHLORIDE 0.9% IV SOLUTION: Freq: Once | INTRAVENOUS | Status: AC

## 2024-09-24 MED ORDER — SODIUM CHLORIDE 0.9% FLUSH: 10.0000 mL | INTRAVENOUS | Status: DC | PRN

## 2024-09-24 MED ORDER — HYDROXYZINE HCL 10 MG/5ML PO SYRP: 10.0000 mg | ORAL_SOLUTION | Freq: Four times a day (QID) | ORAL | Status: DC | PRN
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
## 2024-09-24 NOTE — Progress Notes (Signed)
..  Trauma Event Note    Reason for Call :  Notified by primary RN Alfonso pts BP trending down with a decrease in UOP, 30ml in last 1.5 hr, Dr. Polly contacted, verbal order for NS 1L bolus obtained and entered.   Last imported Vital Signs BP (!) 84/60 (BP Location: Right Arm) Comment: notified TRN of decreasing BP  Pulse (!) 103   Temp (!) 97 F (36.1 C) (Esophageal) Comment: bearhugger back on  Resp 15   Ht 5' 8 (1.727 m)   Wt 125 lb (56.7 kg)   SpO2 99%   BMI 19.01 kg/m   Trending CBC Recent Labs    09/22/24 2023 09/22/24 2230 09/23/24 0235 09/23/24 2123  WBC 20.4*  --  15.1* 18.7*  HGB 5.7* 11.2* 9.9* 8.9*  HCT 18.9* 33.0* 31.0* 26.9*  PLT 234  --  188 177    Trending Coag's Recent Labs    09/22/24 1313  INR 1.7*    Trending BMET Recent Labs    09/22/24 1313 09/22/24 1320 09/22/24 2023 09/22/24 2230 09/23/24 0235  NA 136 138 135 139 137  K 4.5 4.6 4.5 5.4* 5.9*  CL 108 109 108  --  110  CO2 16*  --  13*  --  17*  BUN 16 18 20   --  24*  CREATININE 2.56* 2.60* 2.93*  --  2.91*  GLUCOSE 155* 160* 143*  --  132*      Keydi Giel Dee  Trauma Response RN  Please call TRN at [phone] for further assistance.
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
## 2024-09-24 NOTE — Progress Notes (Signed)
   09/24/24 0803  Daily Weaning Assessment  Daily Assessment of Readiness to Wean Wean protocol criteria met (SBT performed)  SBT Method CPAP 5 cm H20 and PS 5 cm H20  Weaning Start Time 0803  Patient response Passed (Tolerated well)

## 2024-09-25 ENCOUNTER — Encounter (HOSPITAL_COMMUNITY)

## 2024-09-25 ENCOUNTER — Inpatient Hospital Stay (HOSPITAL_COMMUNITY)

## 2024-09-25 DIAGNOSIS — C3490 Malignant neoplasm of unspecified part of unspecified bronchus or lung: Secondary | ICD-10-CM

## 2024-09-25 DIAGNOSIS — Z7189 Other specified counseling: Secondary | ICD-10-CM

## 2024-09-25 DIAGNOSIS — I619 Nontraumatic intracerebral hemorrhage, unspecified: Secondary | ICD-10-CM | POA: Diagnosis not present

## 2024-09-25 DIAGNOSIS — Z515 Encounter for palliative care: Secondary | ICD-10-CM | POA: Diagnosis not present

## 2024-09-25 DIAGNOSIS — R569 Unspecified convulsions: Secondary | ICD-10-CM | POA: Diagnosis not present

## 2024-09-25 DIAGNOSIS — S069XAA Unspecified intracranial injury with loss of consciousness status unknown, initial encounter: Secondary | ICD-10-CM | POA: Diagnosis not present

## 2024-09-25 DIAGNOSIS — Z66 Do not resuscitate: Secondary | ICD-10-CM

## 2024-09-25 LAB — COMPREHENSIVE METABOLIC PANEL WITH GFR
ALT: 24 U/L (ref 0–44)
AST: 35 U/L (ref 15–41)
Albumin: 2.9 g/dL — ABNORMAL LOW (ref 3.5–5.0)
Alkaline Phosphatase: 43 U/L (ref 38–126)
Anion gap: 9 (ref 5–15)
BUN: 40 mg/dL — ABNORMAL HIGH (ref 8–23)
CO2: 16 mmol/L — ABNORMAL LOW (ref 22–32)
Calcium: 7.9 mg/dL — ABNORMAL LOW (ref 8.9–10.3)
Chloride: 117 mmol/L — ABNORMAL HIGH (ref 98–111)
Creatinine, Ser: 3.27 mg/dL — ABNORMAL HIGH (ref 0.61–1.24)
GFR, Estimated: 20 mL/min — ABNORMAL LOW (ref >60)
Glucose, Bld: 79 mg/dL (ref 70–99)
Potassium: 4.3 mmol/L (ref 3.5–5.1)
Sodium: 142 mmol/L (ref 135–145)
Total Bilirubin: 0.9 mg/dL (ref 0.0–1.2)
Total Protein: 5.6 g/dL — ABNORMAL LOW (ref 6.5–8.1)

## 2024-09-25 LAB — BPAM RBC
Blood Product Expiration Date: 202510162359
Blood Product Expiration Date: 202510192359
Blood Product Expiration Date: 202510192359
ISSUE DATE / TIME: 202509232219
ISSUE DATE / TIME: 202509232354
ISSUE DATE / TIME: 202509250937
Unit Type and Rh: 6200
Unit Type and Rh: 6200
Unit Type and Rh: 6200

## 2024-09-25 LAB — CBC
HCT: 29.1 % — ABNORMAL LOW (ref 39.0–52.0)
HCT: 26.7 % — ABNORMAL LOW (ref 39.0–52.0)
Hemoglobin: 9.4 g/dL — ABNORMAL LOW (ref 13.0–17.0)
Hemoglobin: 8.6 g/dL — ABNORMAL LOW (ref 13.0–17.0)
MCH: 28.7 pg (ref 26.0–34.0)
MCH: 29 pg (ref 26.0–34.0)
MCHC: 32.3 g/dL (ref 30.0–36.0)
MCHC: 32.2 g/dL (ref 30.0–36.0)
MCV: 88.7 fL (ref 80.0–100.0)
MCV: 89.9 fL (ref 80.0–100.0)
Platelets: 157 K/uL (ref 150–400)
Platelets: 143 K/uL — ABNORMAL LOW (ref 150–400)
RBC: 3.28 MIL/uL — ABNORMAL LOW (ref 4.22–5.81)
RBC: 2.97 MIL/uL — ABNORMAL LOW (ref 4.22–5.81)
RDW: 19.7 % — ABNORMAL HIGH (ref 11.5–15.5)
RDW: 19.7 % — ABNORMAL HIGH (ref 11.5–15.5)
WBC: 13.4 K/uL — ABNORMAL HIGH (ref 4.0–10.5)
WBC: 11.8 K/uL — ABNORMAL HIGH (ref 4.0–10.5)
nRBC: 0.4 % — ABNORMAL HIGH (ref 0.0–0.2)
nRBC: 0.3 % — ABNORMAL HIGH (ref 0.0–0.2)

## 2024-09-25 LAB — TYPE AND SCREEN
ABO/RH(D): A POS
Antibody Screen: NEGATIVE
Unit division: 0
Unit division: 0
Unit division: 0

## 2024-09-25 MED ORDER — HEPARIN SODIUM (PORCINE) 5000 UNIT/ML IJ SOLN: 5000.0000 [IU] | Freq: Three times a day (TID) | INTRAMUSCULAR | Status: DC

## 2024-09-25 MED ORDER — ORAL CARE MOUTH RINSE: 15.0000 mL | OROMUCOSAL | Status: DC | PRN

## 2024-09-25 MED ORDER — METOPROLOL TARTRATE 5 MG/5ML IV SOLN: 5.0000 mg | Freq: Four times a day (QID) | INTRAVENOUS | Status: DC | PRN

## 2024-09-25 MED ORDER — CHLORHEXIDINE GLUCONATE CLOTH 2 % EX PADS: 6.0000 | MEDICATED_PAD | Freq: Every day | CUTANEOUS | Status: DC

## 2024-09-25 MED ORDER — GLYCOPYRROLATE 1 MG PO TABS: 1.0000 mg | ORAL_TABLET | ORAL | Status: DC | PRN

## 2024-09-25 MED ORDER — BIOTENE DRY MOUTH MT LIQD
15.0000 mL | Freq: Three times a day (TID) | OROMUCOSAL | Status: DC
  Administered 2024-09-25 – 2024-09-28 (×10): 15 mL via TOPICAL

## 2024-09-25 MED ORDER — HYDROMORPHONE HCL 1 MG/ML IJ SOLN
0.5000 mg | INTRAMUSCULAR | Status: DC | PRN
  Administered 2024-09-25: 0.5 mg via INTRAVENOUS
  Filled 2024-09-25: qty 1

## 2024-09-25 MED ORDER — HYDROMORPHONE HCL 1 MG/ML IJ SOLN
1.0000 mg | INTRAMUSCULAR | Status: DC | PRN
  Administered 2024-09-25 – 2024-09-26 (×7): 1 mg via INTRAVENOUS
  Filled 2024-09-25 (×7): qty 1

## 2024-09-25 MED ORDER — GLYCOPYRROLATE 0.2 MG/ML IJ SOLN: 0.2000 mg | INTRAMUSCULAR | Status: DC | PRN

## 2024-09-25 MED ORDER — POLYETHYLENE GLYCOL 3350 17 G PO PACK
17.0000 g | PACK | Freq: Two times a day (BID) | ORAL | Status: DC
Start: 2024-09-25 — End: 2024-09-25

## 2024-09-25 MED ORDER — DIPHENHYDRAMINE HCL 50 MG/ML IJ SOLN
12.5000 mg | Freq: Four times a day (QID) | INTRAMUSCULAR | Status: DC | PRN
  Administered 2024-09-25 (×2): 12.5 mg via INTRAVENOUS
  Filled 2024-09-25 (×4): qty 1

## 2024-09-25 MED ORDER — LORAZEPAM 2 MG/ML IJ SOLN
1.0000 mg | INTRAMUSCULAR | Status: DC | PRN
  Administered 2024-09-25 – 2024-09-26 (×4): 1 mg via INTRAVENOUS
  Filled 2024-09-25 (×5): qty 1

## 2024-09-25 MED ORDER — POLYVINYL ALCOHOL 1.4 % OP SOLN: 1.0000 [drp] | Freq: Four times a day (QID) | OPHTHALMIC | Status: DC | PRN

## 2024-09-25 MED ORDER — HALOPERIDOL LACTATE 2 MG/ML PO CONC: 2.0000 mg | ORAL | Status: DC | PRN

## 2024-09-25 MED ORDER — HYDRALAZINE HCL 20 MG/ML IJ SOLN: 10.0000 mg | INTRAMUSCULAR | Status: DC | PRN

## 2024-09-25 MED ORDER — HALOPERIDOL LACTATE 5 MG/ML IJ SOLN
2.0000 mg | INTRAMUSCULAR | Status: DC | PRN
  Filled 2024-09-25: qty 1

## 2024-09-25 MED ORDER — GLYCOPYRROLATE 0.2 MG/ML IJ SOLN
0.2000 mg | INTRAMUSCULAR | Status: DC | PRN
  Administered 2024-09-25 – 2024-09-27 (×5): 0.2 mg via INTRAVENOUS
  Filled 2024-09-25 (×6): qty 1

## 2024-09-25 MED ORDER — HALOPERIDOL 1 MG PO TABS: 2.0000 mg | ORAL_TABLET | ORAL | Status: DC | PRN
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
## 2024-09-25 NOTE — Progress Notes (Signed)
 Patient's visitor reports posturing movements of bilateral upper extremities, unwitnessed by this RN. Visitor pressed EEG event button at 1514. Dr Michaela notified.

## 2024-09-25 NOTE — Progress Notes (Signed)
 Patient extubated, saturating well on 4l [place], tachycardic, somnolent -discussed with daughter and will get palliative care involved
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
## 2024-09-25 NOTE — Progress Notes (Signed)
 2045- Patient with severe itching episode. Hives noted on patient's abdomen and bilateral legs at this time. (See photos in media in chart review)   During aggressive itching episode, pt dislodged arterial line and two peripheral IVs. TRN contacted who then notified trauma MD, Ann, of arterial line being removed by patient, as well as new onset hives/itching. Per MD, okay to replace a-line unless patient's BP becomes labile/low again. See new orders for benadryl  and atarax  for hives/itching.   2300- patient becoming dyssynchronous with ventilator, expiratory wheezes, and blood pressures becoming more labile with SBP in low 80s. It became difficult to adequately sedate patient for vent synchrony and work of breathing due to response from blood pressure. Contacted TRN and trauma MD. Orders placed for arterial line, nebulizer, and albumin .

## 2024-09-25 NOTE — Progress Notes (Signed)
 LTM  EEG recording with CT compatible leads. Patient being monitored by [facility]. Test button explained to family at bedside.

## 2024-09-25 NOTE — Progress Notes (Signed)
 Noted change to comfort care. Would continue keppra  while still feasible. We will continue to be available on an as needed basis. Please call with further questions or concerns.   Aisha Seals, MD [facility]   If 7pm- 7am, please page neurology on call as listed in AMION.

## 2024-09-25 NOTE — Progress Notes (Signed)
 Patient noted to have hives on abdomen and bilateral legs at 1430. Dr. Teresa notified. See new orders.

## 2024-09-25 NOTE — Progress Notes (Signed)
 Patient transported to CT and back to 4N without complication.

## 2024-09-25 NOTE — Procedures (Signed)
 Extubation Procedure Note  Patient Details:   Name: Jerry Wall DOB: July 22, 1955 MRN: [id number]   Airway Documentation:    Vent end date: 09/25/24 Vent end time: 1029   Evaluation  O2 sats: stable throughout Complications: No apparent complications Patient did tolerate procedure well. Bilateral Breath Sounds: Rhonchi, Expiratory wheezes   Yes  Positive cuff leak noted. Patient placed on 4L [place] with humidity, no stridor noted.  Cy GORMAN Pan 09/25/2024, 10:33 AM

## 2024-09-25 NOTE — Procedures (Signed)
 Arterial Line Insertion Start/End9/26/2025 12:15 AM, 09/25/2024 12:37 AM  Patient location: ICU. Preanesthetic checklist: patient identified, monitors and equipment checked and timeout performed Left, radial was placed Hand hygiene performed , maximum sterile barriers used  and Seldinger technique used  Attempts: 1 Procedure performed without using ultrasound guided technique. Following insertion, dressing applied and Biopatch. Post procedure assessment: normal  Patient tolerated the procedure well with no immediate complications.

## 2024-09-26 ENCOUNTER — Inpatient Hospital Stay (HOSPITAL_COMMUNITY)

## 2024-09-26 DIAGNOSIS — I619 Nontraumatic intracerebral hemorrhage, unspecified: Secondary | ICD-10-CM | POA: Diagnosis not present

## 2024-09-26 DIAGNOSIS — S92901B Unspecified fracture of right foot, initial encounter for open fracture: Secondary | ICD-10-CM

## 2024-09-26 DIAGNOSIS — S36112A Contusion of liver, initial encounter: Secondary | ICD-10-CM

## 2024-09-26 DIAGNOSIS — S2241XA Multiple fractures of ribs, right side, initial encounter for closed fracture: Secondary | ICD-10-CM | POA: Diagnosis not present

## 2024-09-26 DIAGNOSIS — R569 Unspecified convulsions: Secondary | ICD-10-CM | POA: Diagnosis not present

## 2024-09-26 DIAGNOSIS — Z515 Encounter for palliative care: Secondary | ICD-10-CM | POA: Diagnosis not present

## 2024-09-26 MED ORDER — SODIUM CHLORIDE 0.9 % IV SOLN
2.0000 mg/h | INTRAVENOUS | Status: DC
  Administered 2024-09-26: 0.5 mg/h via INTRAVENOUS
  Administered 2024-09-27: 1 mg/h via INTRAVENOUS
  Administered 2024-09-28: 2 mg/h via INTRAVENOUS
  Administered 2024-09-29: 4 mg/h via INTRAVENOUS
  Filled 2024-09-26 (×4): qty 5

## 2024-09-26 MED ORDER — HYDROMORPHONE BOLUS VIA INFUSION
1.0000 mg | INTRAVENOUS | Status: DC | PRN
  Administered 2024-09-26 – 2024-09-28 (×16): 1 mg via INTRAVENOUS

## 2024-09-26 MED ORDER — HYDROMORPHONE HCL-NACL 50-0.9 MG/50ML-% IV SOLN
0.5000 mg/h | INTRAVENOUS | Status: DC
  Filled 2024-09-26 (×2): qty 50

## 2024-09-26 MED ORDER — HYDROMORPHONE HCL 1 MG/ML IJ SOLN
1.0000 mg | INTRAMUSCULAR | Status: DC
  Administered 2024-09-26: 1 mg via INTRAVENOUS
  Filled 2024-09-26: qty 1

## 2024-09-26 NOTE — Plan of Care (Signed)
  Problem: Education: Goal: Knowledge of General Education information will improve Description: Including pain rating scale, medication(s)/side effects and non-pharmacologic comfort measures Outcome: Not Progressing   Problem: Health Behavior/Discharge Planning: Goal: Ability to manage health-related needs will improve Outcome: Not Progressing   Problem: Clinical Measurements: Goal: Ability to maintain clinical measurements within normal limits will improve Outcome: Not Progressing Goal: Will remain free from infection Outcome: Not Progressing Goal: Diagnostic test results will improve Outcome: Not Progressing Goal: Respiratory complications will improve Outcome: Not Progressing Goal: Cardiovascular complication will be avoided Outcome: Not Progressing   Problem: Activity: Goal: Risk for activity intolerance will decrease Outcome: Not Progressing   Problem: Nutrition: Goal: Adequate nutrition will be maintained Outcome: Not Progressing   Problem: Coping: Goal: Level of anxiety will decrease Outcome: Not Progressing   Problem: Elimination: Goal: Will not experience complications related to bowel motility Outcome: Not Progressing Goal: Will not experience complications related to urinary retention Outcome: Not Progressing   Problem: Pain Managment: Goal: General experience of comfort will improve and/or be controlled Outcome: Not Progressing   Problem: Safety: Goal: Ability to remain free from injury will improve Outcome: Not Progressing   Problem: Skin Integrity: Goal: Risk for impaired skin integrity will decrease Outcome: Not Progressing   Problem: Safety: Goal: Non-violent Restraint(s) Outcome: Not Progressing   Problem: Education: Goal: Knowledge of the prescribed therapeutic regimen will improve Outcome: Not Progressing   Problem: Coping: Goal: Ability to identify and develop effective coping behavior will improve Outcome: Not Progressing   Problem:  Clinical Measurements: Goal: Quality of life will improve Outcome: Not Progressing   Problem: Respiratory: Goal: Verbalizations of increased ease of respirations will increase Outcome: Not Progressing   Problem: Role Relationship: Goal: Family's ability to cope with current situation will improve Outcome: Not Progressing Goal: Ability to verbalize concerns, feelings, and thoughts to partner or family member will improve Outcome: Not Progressing   Problem: Pain Management: Goal: Satisfaction with pain management regimen will improve Outcome: Not Progressing
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
## 2024-09-26 NOTE — Progress Notes (Signed)
 LTM EEG disconnected - no skin breakdown at [facility]. [facility] notified.
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
## 2024-09-27 DIAGNOSIS — I619 Nontraumatic intracerebral hemorrhage, unspecified: Secondary | ICD-10-CM | POA: Diagnosis not present

## 2024-09-27 DIAGNOSIS — G9340 Encephalopathy, unspecified: Secondary | ICD-10-CM | POA: Insufficient documentation

## 2024-09-27 DIAGNOSIS — Z87898 Personal history of other specified conditions: Secondary | ICD-10-CM

## 2024-09-27 DIAGNOSIS — S2241XA Multiple fractures of ribs, right side, initial encounter for closed fracture: Secondary | ICD-10-CM | POA: Diagnosis not present

## 2024-09-27 DIAGNOSIS — Z515 Encounter for palliative care: Secondary | ICD-10-CM | POA: Diagnosis not present

## 2024-09-27 MED ORDER — LORAZEPAM 2 MG/ML IJ SOLN
1.0000 mg | INTRAMUSCULAR | Status: DC | PRN
  Administered 2024-09-28: 1 mg via INTRAVENOUS
  Filled 2024-09-27: qty 1

## 2024-09-27 MED ORDER — GLYCOPYRROLATE 0.2 MG/ML IJ SOLN
0.3000 mg | INTRAMUSCULAR | Status: DC
  Administered 2024-09-27 – 2024-09-28 (×10): 0.3 mg via INTRAVENOUS
  Filled 2024-09-27 (×10): qty 2

## 2024-09-27 NOTE — Plan of Care (Signed)
  Problem: Education: Goal: Knowledge of General Education information will improve Description: Including pain rating scale, medication(s)/side effects and non-pharmacologic comfort measures Outcome: Not Progressing   Problem: Health Behavior/Discharge Planning: Goal: Ability to manage health-related needs will improve Outcome: Not Progressing   Problem: Clinical Measurements: Goal: Ability to maintain clinical measurements within normal limits will improve Outcome: Not Progressing Goal: Will remain free from infection Outcome: Not Progressing Goal: Diagnostic test results will improve Outcome: Not Progressing Goal: Respiratory complications will improve Outcome: Not Progressing Goal: Cardiovascular complication will be avoided Outcome: Not Progressing   Problem: Activity: Goal: Risk for activity intolerance will decrease Outcome: Not Progressing   Problem: Nutrition: Goal: Adequate nutrition will be maintained Outcome: Not Progressing   Problem: Coping: Goal: Level of anxiety will decrease Outcome: Not Progressing   Problem: Elimination: Goal: Will not experience complications related to bowel motility Outcome: Not Progressing Goal: Will not experience complications related to urinary retention Outcome: Not Progressing   Problem: Pain Managment: Goal: General experience of comfort will improve and/or be controlled Outcome: Not Progressing   Problem: Safety: Goal: Ability to remain free from injury will improve Outcome: Not Progressing   Problem: Skin Integrity: Goal: Risk for impaired skin integrity will decrease Outcome: Not Progressing   Problem: Safety: Goal: Non-violent Restraint(s) Outcome: Not Progressing   Problem: Education: Goal: Knowledge of the prescribed therapeutic regimen will improve Outcome: Not Progressing   Problem: Coping: Goal: Ability to identify and develop effective coping behavior will improve Outcome: Not Progressing   Problem:  Clinical Measurements: Goal: Quality of life will improve Outcome: Not Progressing   Problem: Respiratory: Goal: Verbalizations of increased ease of respirations will increase Outcome: Not Progressing   Problem: Role Relationship: Goal: Family's ability to cope with current situation will improve Outcome: Not Progressing Goal: Ability to verbalize concerns, feelings, and thoughts to partner or family member will improve Outcome: Not Progressing   Problem: Pain Management: Goal: Satisfaction with pain management regimen will improve Outcome: Not Progressing
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
## 2024-09-27 NOTE — Plan of Care (Signed)
  Problem: Education: Goal: Knowledge of General Education information will improve Description: Including pain rating scale, medication(s)/side effects and non-pharmacologic comfort measures Outcome: Progressing   Problem: Health Behavior/Discharge Planning: Goal: Ability to manage health-related needs will improve Outcome: Progressing   Problem: Clinical Measurements: Goal: Ability to maintain clinical measurements within normal limits will improve Outcome: Progressing Goal: Will remain free from infection Outcome: Progressing Goal: Diagnostic test results will improve Outcome: Progressing Goal: Respiratory complications will improve Outcome: Progressing Goal: Cardiovascular complication will be avoided Outcome: Progressing   Problem: Activity: Goal: Risk for activity intolerance will decrease Outcome: Progressing   Problem: Nutrition: Goal: Adequate nutrition will be maintained Outcome: Progressing   Problem: Coping: Goal: Level of anxiety will decrease Outcome: Progressing   Problem: Elimination: Goal: Will not experience complications related to bowel motility Outcome: Progressing Goal: Will not experience complications related to urinary retention Outcome: Progressing   Problem: Pain Managment: Goal: General experience of comfort will improve and/or be controlled Outcome: Progressing   Problem: Safety: Goal: Ability to remain free from injury will improve Outcome: Progressing   Problem: Skin Integrity: Goal: Risk for impaired skin integrity will decrease Outcome: Progressing   Problem: Safety: Goal: Non-violent Restraint(s) Outcome: Progressing   Problem: Education: Goal: Knowledge of the prescribed therapeutic regimen will improve Outcome: Progressing   Problem: Coping: Goal: Ability to identify and develop effective coping behavior will improve Outcome: Progressing   Problem: Clinical Measurements: Goal: Quality of life will improve Outcome:  Progressing   Problem: Respiratory: Goal: Verbalizations of increased ease of respirations will increase Outcome: Progressing   Problem: Role Relationship: Goal: Family's ability to cope with current situation will improve Outcome: Progressing Goal: Ability to verbalize concerns, feelings, and thoughts to partner or family member will improve Outcome: Progressing   Problem: Pain Management: Goal: Satisfaction with pain management regimen will improve Outcome: Progressing
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
## 2024-09-27 NOTE — Progress Notes (Signed)
 Patient has not had a bowel movement for several days. Family at bedside were educated of the importance to have one and stool softeners were offered, but the family refused them at this time. Provider aware, will continue to monitor.

## 2024-09-28 DIAGNOSIS — I619 Nontraumatic intracerebral hemorrhage, unspecified: Secondary | ICD-10-CM | POA: Diagnosis not present

## 2024-09-28 DIAGNOSIS — Z515 Encounter for palliative care: Secondary | ICD-10-CM | POA: Diagnosis not present

## 2024-09-28 DIAGNOSIS — S2241XA Multiple fractures of ribs, right side, initial encounter for closed fracture: Secondary | ICD-10-CM | POA: Diagnosis not present

## 2024-09-28 MED ORDER — BISACODYL 10 MG RE SUPP: 10.0000 mg | Freq: Two times a day (BID) | RECTAL | Status: DC | PRN
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
## 2024-09-28 NOTE — Progress Notes (Signed)
 Per daughters request to take oxygen off.

## 2024-09-28 NOTE — Care Management Important Message (Signed)
 Important Message  Patient Details  Name: Jerry Wall MRN: [id number] Date of Birth: December 19, 1955   Important Message Given:  Yes - Medicare IM     Claretta Deed 09/28/2024, 3:19 PM
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
## 2024-09-28 NOTE — Progress Notes (Signed)
 18mL Hydromorphone  infusion wasted with Rocky RN
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
## 2024-09-30 DEATH — deceased

## 2024-10-31 NOTE — Progress Notes (Signed)
 Patient had moderate malnutrition related to his cancer.
